# Patient Record
Sex: Male | Born: 1980 | Race: Black or African American | Hispanic: No | Marital: Married | Smoking: Current every day smoker
Health system: Southern US, Community
[De-identification: ages and names within clinical notes are randomized; demographics above are authoritative.]

## PROBLEM LIST (undated history)

## (undated) DIAGNOSIS — F419 Anxiety disorder, unspecified: Secondary | ICD-10-CM

## (undated) DIAGNOSIS — Z8614 Personal history of Methicillin resistant Staphylococcus aureus infection: Secondary | ICD-10-CM

## (undated) DIAGNOSIS — J302 Other seasonal allergic rhinitis: Secondary | ICD-10-CM

## (undated) DIAGNOSIS — Z98811 Dental restoration status: Secondary | ICD-10-CM

## (undated) DIAGNOSIS — H5010 Unspecified exotropia: Secondary | ICD-10-CM

## (undated) DIAGNOSIS — R45850 Homicidal ideations: Secondary | ICD-10-CM

## (undated) DIAGNOSIS — K219 Gastro-esophageal reflux disease without esophagitis: Secondary | ICD-10-CM

## (undated) HISTORY — PX: ABSCESS DRAINAGE: SHX1119

## (undated) HISTORY — DX: Homicidal ideations: R45.850

## (undated) HISTORY — DX: Anxiety disorder, unspecified: F41.9

## (undated) HISTORY — PX: TENDON REPAIR: SHX5111

---
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
## 1999-08-14 ENCOUNTER — Emergency Department (HOSPITAL_COMMUNITY): Admission: EM | Admit: 1999-08-14 | Discharge: 1999-08-14 | Payer: Self-pay | Admitting: Emergency Medicine

## 1999-08-15 ENCOUNTER — Encounter: Payer: Self-pay | Admitting: Emergency Medicine

## 2001-01-21 ENCOUNTER — Ambulatory Visit (HOSPITAL_COMMUNITY): Admission: EM | Admit: 2001-01-21 | Discharge: 2001-01-21 | Payer: Self-pay | Admitting: Emergency Medicine

## 2001-01-21 ENCOUNTER — Encounter: Payer: Self-pay | Admitting: Emergency Medicine

## 2001-02-01 ENCOUNTER — Emergency Department (HOSPITAL_COMMUNITY): Admission: EM | Admit: 2001-02-01 | Discharge: 2001-02-01 | Payer: Self-pay | Admitting: Emergency Medicine

## 2001-05-20 ENCOUNTER — Emergency Department (HOSPITAL_COMMUNITY): Admission: EM | Admit: 2001-05-20 | Discharge: 2001-05-20 | Payer: Self-pay | Admitting: Emergency Medicine

## 2001-05-20 ENCOUNTER — Encounter: Payer: Self-pay | Admitting: Emergency Medicine

## 2002-10-21 ENCOUNTER — Emergency Department (HOSPITAL_COMMUNITY): Admission: EM | Admit: 2002-10-21 | Discharge: 2002-10-21 | Payer: Self-pay | Admitting: Emergency Medicine

## 2003-01-21 ENCOUNTER — Emergency Department (HOSPITAL_COMMUNITY): Admission: EM | Admit: 2003-01-21 | Discharge: 2003-01-21 | Payer: Self-pay | Admitting: Emergency Medicine

## 2003-02-21 ENCOUNTER — Emergency Department (HOSPITAL_COMMUNITY): Admission: EM | Admit: 2003-02-21 | Discharge: 2003-02-21 | Payer: Self-pay | Admitting: Emergency Medicine

## 2003-09-16 ENCOUNTER — Emergency Department (HOSPITAL_COMMUNITY): Admission: EM | Admit: 2003-09-16 | Discharge: 2003-09-16 | Payer: Self-pay | Admitting: Emergency Medicine

## 2003-09-18 ENCOUNTER — Emergency Department (HOSPITAL_COMMUNITY): Admission: EM | Admit: 2003-09-18 | Discharge: 2003-09-18 | Payer: Self-pay | Admitting: Emergency Medicine

## 2003-11-03 ENCOUNTER — Emergency Department (HOSPITAL_COMMUNITY): Admission: EM | Admit: 2003-11-03 | Discharge: 2003-11-03 | Payer: Self-pay

## 2003-11-06 ENCOUNTER — Emergency Department (HOSPITAL_COMMUNITY): Admission: EM | Admit: 2003-11-06 | Discharge: 2003-11-06 | Payer: Self-pay | Admitting: Emergency Medicine

## 2003-11-09 ENCOUNTER — Emergency Department (HOSPITAL_COMMUNITY): Admission: EM | Admit: 2003-11-09 | Discharge: 2003-11-09 | Payer: Self-pay | Admitting: Emergency Medicine

## 2003-12-25 ENCOUNTER — Emergency Department (HOSPITAL_COMMUNITY): Admission: EM | Admit: 2003-12-25 | Discharge: 2003-12-25 | Payer: Self-pay | Admitting: Emergency Medicine

## 2004-01-18 ENCOUNTER — Emergency Department (HOSPITAL_COMMUNITY): Admission: EM | Admit: 2004-01-18 | Discharge: 2004-01-18 | Payer: Self-pay | Admitting: Emergency Medicine

## 2004-01-19 ENCOUNTER — Emergency Department (HOSPITAL_COMMUNITY): Admission: EM | Admit: 2004-01-19 | Discharge: 2004-01-19 | Payer: Self-pay | Admitting: Emergency Medicine

## 2004-01-20 ENCOUNTER — Inpatient Hospital Stay (HOSPITAL_COMMUNITY): Admission: EM | Admit: 2004-01-20 | Discharge: 2004-01-24 | Payer: Self-pay | Admitting: Emergency Medicine

## 2004-10-16 ENCOUNTER — Emergency Department (HOSPITAL_COMMUNITY): Admission: EM | Admit: 2004-10-16 | Discharge: 2004-10-16 | Payer: Self-pay | Admitting: *Deleted

## 2004-10-18 ENCOUNTER — Emergency Department (HOSPITAL_COMMUNITY): Admission: EM | Admit: 2004-10-18 | Discharge: 2004-10-18 | Payer: Self-pay | Admitting: Emergency Medicine

## 2005-03-17 ENCOUNTER — Emergency Department (HOSPITAL_COMMUNITY): Admission: EM | Admit: 2005-03-17 | Discharge: 2005-03-17 | Payer: Self-pay | Admitting: Emergency Medicine

## 2005-08-11 ENCOUNTER — Emergency Department (HOSPITAL_COMMUNITY): Admission: EM | Admit: 2005-08-11 | Discharge: 2005-08-11 | Payer: Self-pay | Admitting: Emergency Medicine

## 2006-01-29 ENCOUNTER — Ambulatory Visit: Payer: Self-pay | Admitting: Family Medicine

## 2006-02-05 ENCOUNTER — Ambulatory Visit: Payer: Self-pay | Admitting: Family Medicine

## 2006-05-31 ENCOUNTER — Emergency Department (HOSPITAL_COMMUNITY): Admission: EM | Admit: 2006-05-31 | Discharge: 2006-05-31 | Payer: Self-pay | Admitting: Emergency Medicine

## 2008-11-06 ENCOUNTER — Telehealth (INDEPENDENT_AMBULATORY_CARE_PROVIDER_SITE_OTHER): Payer: Self-pay | Admitting: *Deleted

## 2009-11-05 ENCOUNTER — Emergency Department (HOSPITAL_COMMUNITY): Admission: EM | Admit: 2009-11-05 | Discharge: 2009-11-05 | Payer: Self-pay | Admitting: Emergency Medicine

## 2010-01-31 ENCOUNTER — Encounter (INDEPENDENT_AMBULATORY_CARE_PROVIDER_SITE_OTHER): Payer: Self-pay | Admitting: *Deleted

## 2010-02-10 ENCOUNTER — Emergency Department (HOSPITAL_COMMUNITY): Admission: EM | Admit: 2010-02-10 | Discharge: 2010-02-11 | Payer: Self-pay | Admitting: Emergency Medicine

## 2010-02-10 ENCOUNTER — Encounter (INDEPENDENT_AMBULATORY_CARE_PROVIDER_SITE_OTHER): Payer: Self-pay | Admitting: *Deleted

## 2010-02-14 ENCOUNTER — Ambulatory Visit: Payer: Self-pay | Admitting: Family Medicine

## 2010-02-14 DIAGNOSIS — M549 Dorsalgia, unspecified: Secondary | ICD-10-CM | POA: Insufficient documentation

## 2010-02-15 ENCOUNTER — Ambulatory Visit: Payer: Self-pay | Admitting: Family Medicine

## 2010-03-08 ENCOUNTER — Ambulatory Visit: Payer: Self-pay | Admitting: Family Medicine

## 2010-03-08 DIAGNOSIS — F410 Panic disorder [episodic paroxysmal anxiety] without agoraphobia: Secondary | ICD-10-CM | POA: Insufficient documentation

## 2010-03-08 DIAGNOSIS — M129 Arthropathy, unspecified: Secondary | ICD-10-CM | POA: Insufficient documentation

## 2010-03-22 ENCOUNTER — Emergency Department (HOSPITAL_COMMUNITY)
Admission: EM | Admit: 2010-03-22 | Discharge: 2010-03-22 | Payer: Self-pay | Source: Home / Self Care | Admitting: Emergency Medicine

## 2010-04-19 ENCOUNTER — Ambulatory Visit
Admission: RE | Admit: 2010-04-19 | Discharge: 2010-04-19 | Payer: Self-pay | Source: Home / Self Care | Attending: Family Medicine | Admitting: Family Medicine

## 2010-04-19 DIAGNOSIS — K602 Anal fissure, unspecified: Secondary | ICD-10-CM | POA: Insufficient documentation

## 2010-04-20 ENCOUNTER — Encounter: Payer: Self-pay | Admitting: Gastroenterology

## 2010-04-21 ENCOUNTER — Telehealth: Payer: Self-pay | Admitting: Family Medicine

## 2010-04-28 ENCOUNTER — Ambulatory Visit: Payer: Self-pay | Admitting: Gastroenterology

## 2010-04-28 NOTE — Assessment & Plan Note (Signed)
Summary: 30 min ov/ok per doc/njr   Vital Signs:  Patient profile:   30 year old male Height:      72 inches Weight:      242.5 pounds Temp:     98.1 degrees F oral Pulse rate:   88 / minute BP sitting:   110 / 90  (left arm) Cuff size:   regular  Vitals Entered By: Kathlene November LPN (March 08, 2010 4:12 PM) CC: back pain off and on- left knee pain- hears knee popping. Pt states wants to get back into power lifting.  Also states that Vicoden is causing him to itch and says the Percocet did not   CC:  back pain off and on- left knee pain- hears knee popping. Pt states wants to get back into power lifting.  Also states that Vicoden is causing him to itch and says the Percocet did not.  History of Present Illness: [name] is a 30 year old male, who comes in today for evaluation of 3 problems.  He has had some low back pain for the past month.  We saw him on November the 22nd.  It the time exam was negative.  We outlined a treatment program, which he did, and he spent about 95% improved however, he complains of still he has intermittent episodes of low back pain.  However, it does not interfere with his ability to work.  He said problems with his left knee for over a year.  He describes pain and swelling and occasional locking.  He states he hurt his knee as a teenager playing sports.  Also for the past 5, years.  He's had panic attacks.  They now have increased in frequency.  They occur about twice a week.  They last for an hour or two.  Telemetry start the same with episodes of rapid heart rate.  This sense to shortness of breath and impending doom.  Current Medications (verified): 1)  Flexeril 10 Mg Tabs (Cyclobenzaprine Hcl) .... Take 1 Tablet By Mouth Three Times A Day 2)  Vicodin Es 7.5-750 Mg Tabs (Hydrocodone-Acetaminophen) .... Take 1 Tablet By Mouth Three Times A Day  Allergies (verified): No Known Drug Allergies  Comments:  Nurse/Medical Assistant: The patient's  medications and allergies were reviewed with the patient and were updated in the Medication and Allergy Lists. Kathlene November LPN (March 08, 2010 4:14 PM)  Past History:  Past medical, surgical, family and social histories (including risk factors) reviewed for relevance to current acute and chronic problems.  Past Medical History: Reviewed history from 11/21/2006 and no changes required. Left leg staph infection-2006  Family History: Reviewed history from 11/21/2006 and no changes required. Family History Diabetes 1st degree relative Family History Hypertension  Social History: Reviewed history from 11/21/2006 and no changes required. Single Current Smoker Alcohol use-no  Review of Systems      See HPI  Physical Exam  General:  Well-developed,well-nourished,in no acute distress; alert,appropriate and cooperative throughout examination Msk:  examination the spine is normal.  No palpable tenderness.  His strength is all normal.  He has extremely tight hamstrings.  The left knee appears normal.  It is not hot.  Ligaments are intact.  He has palpable tenderness over the lateral joint line. Neurologic:  No cranial nerve deficits noted. Station and gait are normal. Plantar reflexes are down-going bilaterally. DTRs are symmetrical throughout. Sensory, motor and coordinative functions appear intact.   Problems:  Medical Problems Added: 1)  Dx of Panic Disorder,no Agoraphobia  (  ICD-300.01) 2)  Dx of Arthropathy Nos, Unspecified Site  (ICD-716.90)  Impression & Recommendations:  Problem # 1:  PANIC DISORDER,NO AGORAPHOBIA (ICD-300.01) Assessment New  His updated medication list for this problem includes:    Celexa 20 Mg Tabs (Citalopram hydrobromide) .Marland Kitchen... 1 tab @ bedtime    Ativan 0.5 Mg Tabs (Lorazepam) .Marland Kitchen... 1 by mouth stat for panic attack  Problem # 2:  ARTHROPATHY NOS, UNSPECIFIED SITE (ICD-716.90) Assessment: New  Problem # 3:  BACK PAIN (ICD-724.5) Assessment:  Improved  His updated medication list for this problem includes:    Flexeril 10 Mg Tabs (Cyclobenzaprine hcl) .Marland Kitchen... Take 1 tablet by mouth three times a day    Vicodin Es 7.5-750 Mg Tabs (Hydrocodone-acetaminophen) .Marland Kitchen... Take 1 tablet by mouth three times a day  Complete Medication List: 1)  Flexeril 10 Mg Tabs (Cyclobenzaprine hcl) .... Take 1 tablet by mouth three times a day 2)  Vicodin Es 7.5-750 Mg Tabs (Hydrocodone-acetaminophen) .... Take 1 tablet by mouth three times a day 3)  Celexa 20 Mg Tabs (Citalopram hydrobromide) .Marland Kitchen.. 1 tab @ bedtime 4)  Ativan 0.5 Mg Tabs (Lorazepam) .Marland Kitchen.. 1 by mouth stat for panic attack  Patient Instructions: 1)  for your back, I would recommend you take 600 mg of Motrin twice daily with food and do the stretching exercises that I outlined 15 minutes daily. 2)  Call the orthopedic group [facility] and make an appointment to see Dr. Cleophas Dunker for evaluation of your knee. 3)  Begin Celexa 20 mg a day at bedtime.  Return in 6 weeks for follow-up.  In the meantime if you have a breakthrough panic attack.  He can take an Ativan .5 stat Prescriptions: CELEXA 20 MG TABS (CITALOPRAM HYDROBROMIDE) 1 tab @ bedtime  #100 x 0   Entered and Authorized by:   Roderick Pee MD   Signed by:   Roderick Pee MD on 03/08/2010   Method used:   Print then Give to Patient   RxID:   [id number] ATIVAN 0.5 MG TABS (LORAZEPAM) 1 by mouth stat for panic attack  #15 x 1   Entered and Authorized by:   Roderick Pee MD   Signed by:   Roderick Pee MD on 03/08/2010   Method used:   Print then Give to Patient   RxID:   [id number] CELEXA 20 MG TABS (CITALOPRAM HYDROBROMIDE) 1 tab @ bedtime  #100 x 0   Entered and Authorized by:   Roderick Pee MD   Signed by:   Roderick Pee MD on 03/08/2010   Method used:   Electronically to        [facility] [id number]* (retail)       [address] [place], [place]  [address]       Ph: [phone]       Fax: [phone]    RxID:   [id number]    Orders Added: 1)  Est. Patient Level IV [93716]

## 2010-04-28 NOTE — Letter (Signed)
Summary: Out of Work  Adult nurse at [employer]  [address]   [place], [place] [address]   Phone: [phone]  Fax: [phone]    February 15, 2010   Employee:  [name]    To Whom It May Concern:   For Medical reasons, please excuse the above named employee from work for the following dates:  Start:   February 14, 2010  End:   February 22, 2010  If you need additional information, please feel free to contact our office.         Sincerely,    Kelle Darting, MD

## 2010-04-28 NOTE — Assessment & Plan Note (Signed)
Summary: 6 WK ROV / RS   Vital Signs:  Patient profile:   30 year old male Weight:      251 pounds Temp:     98.1 degrees F oral BP sitting:   110 / 80  (left arm) Cuff size:   regular  Vitals Entered By: Kern Reap CMA Duncan Dull) (April 19, 2010 3:33 PM) CC: follow-up visit   CC:  follow-up visit.  History of Present Illness: ed is a 30 year old male, single, nonsmoker, who comes in today for evaluation of two problems.  We diagnosed him to have panic attacks and start him on Celexa 20 mg nightly on December the 13th of this past year.  We also gave him Ativan .5 to take p.r.n. he says that he initially got much better, but now the back.  Frequency of the same.  A couple weeks ago.  He stated he had had urgent care because of rectal bleeding and pain.  He was told he had a rectal fissure.  He was given medication tsolved and now is back  Allergies: No Known Drug Allergies  Past History:  Past medical, surgical, family and social histories (including risk factors) reviewed for relevance to current acute and chronic problems.  Past Medical History: Reviewed history from 11/21/2006 and no changes required. Left leg staph infection-2006  Family History: Reviewed history from 11/21/2006 and no changes required. Family History Diabetes 1st degree relative Family History Hypertension  Social History: Reviewed history from 11/21/2006 and no changes required. Single Current Smoker Alcohol use-no  Review of Systems      See HPI  Physical Exam  General:  Well-developed,well-nourished,in no acute distress; alert,appropriate and cooperative throughout examination Psych:  Cognition and judgment appear intact. Alert and cooperative with normal attention span and concentration. No apparent delusions, illusions, hallucinations   Impression & Recommendations:  Problem # 1:  PANIC DISORDER,NO AGORAPHOBIA (ICD-300.01) Assessment Improved  The following medications were  removed from the medication list:    Celexa 20 Mg Tabs (Citalopram hydrobromide) .Marland Kitchen... 1 tab @ bedtime His updated medication list for this problem includes:    Ativan 0.5 Mg Tabs (Lorazepam) .Marland Kitchen... 1 by mouth stat for panic attack    Celexa 40 Mg Tabs (Citalopram hydrobromide) .Marland Kitchen... 1 tab @ bedtime  Problem # 2:  RECTAL FISSURE (ICD-565.0) Assessment: New  Orders: Gastroenterology Referral (GI)  Complete Medication List: 1)  Flexeril 10 Mg Tabs (Cyclobenzaprine hcl) .... Take 1 tablet by mouth three times a day 2)  Vicodin Es 7.5-750 Mg Tabs (Hydrocodone-acetaminophen) .... Take 1 tablet by mouth three times a day 3)  Ativan 0.5 Mg Tabs (Lorazepam) .Marland Kitchen.. 1 by mouth stat for panic attack 4)  Nupercainal 1 % Oint (Dibucaine) .... Apply 4 x day 5)  Celexa 40 Mg Tabs (Citalopram hydrobromide) .Marland Kitchen.. 1 tab @ bedtime 6)  Tramadol Hcl 50 Mg Tabs (Tramadol hcl) .... Take 1 tablet by mouth two times a day as needed pain  Patient Instructions: 1)  increase the Celexa to 40 mg daily, and taken Ativan p.r.n. 2)   30 ounces of water daily. 3)  Milk of Magnesia 34ounces twice daily or prune juice 4  ounces twice daily.  nupercainal          apply small amounts 3 to 4 times daily, and we will get you set up a consult and GI Prescriptions: CELEXA 40 MG TABS (CITALOPRAM HYDROBROMIDE) 1 tab @ bedtime  #100 x 3   Entered and Authorized by:  Roderick Pee MD   Signed by:   Roderick Pee MD on 04/19/2010   Method used:   Print then Give to Patient   RxID:   [id number] NUPERCAINAL 1 % OINT (DIBUCAINE) apply 4 x day  #30 gr x 2   Entered and Authorized by:   Roderick Pee MD   Signed by:   Roderick Pee MD on 04/19/2010   Method used:   Print then Give to Patient   RxID:   [id number] TRAMADOL HCL 50 MG TABS (TRAMADOL HCL) Take 1 tablet by mouth two times a day as needed pain  #30 x 0   Entered and Authorized by:   Roderick Pee MD   Signed by:   Roderick Pee MD on 04/19/2010   Method  used:   Print then Give to Patient   RxID:   [id number] ATIVAN 0.5 MG TABS (LORAZEPAM) 1 by mouth stat for panic attack  #30 x 0   Entered and Authorized by:   Roderick Pee MD   Signed by:   Roderick Pee MD on 04/19/2010   Method used:   Print then Give to Patient   RxID:   [id number] CELEXA 40 MG TABS (CITALOPRAM HYDROBROMIDE) 1 tab @ bedtime  #100 x 3   Entered and Authorized by:   Roderick Pee MD   Signed by:   Roderick Pee MD on 04/19/2010   Method used:   Electronically to        [id number]* (retail)       [address] [place], [place]  [address]       Ph: [phone]       Fax: [phone]   RxID:   [id number] NUPERCAINAL 1 % OINT (DIBUCAINE) apply 4 x day  #30 gr x 2   Entered and Authorized by:   Roderick Pee MD   Signed by:   Roderick Pee MD on 04/19/2010   Method used:   Electronically to        [id number]* (retail)       [address] [place], [place]  [address]       Ph: [phone]       Fax: [phone]   RxID:   [id number]    Orders Added: 1)  Gastroenterology Referral [GI] 2)  Est. Patient Level IV [02585]

## 2010-04-28 NOTE — Letter (Signed)
Summary: Out of Work  Adult nurse at [employer]  [address]   [place], [place] [address]   Phone: [phone]  Fax: [phone]    February 14, 2010   Employee:  IZAYAH MINER Klumpp    To Whom It May Concern:   For Medical reasons, please excuse the above named employee from work for the following dates:  Start:   February 14, 2010  End:   February 16, 2010  If you need additional information, please feel free to contact our office.         Sincerely,    Kelle Darting, MD

## 2010-04-28 NOTE — Progress Notes (Signed)
Summary: Pt says pain med making him itch. Req different pain med  Phone Note Call from Patient Call back at [phone]       Caller: Patient Summary of Call: Pt called and said that the pain med prescribed to him is making him itch. Pt req a diff pain med called in to [facility] [address]...  Pls notify pt when this has been called in.  Initial call taken by: Lucy Antigua,  April 21, 2010 9:20 AM  Follow-up for Phone Call        if the pain medication is causing itching, then stop it completely, and take Motrin 400 mg 3 times a day p.r.n. Follow-up by: Roderick Pee MD,  April 21, 2010 2:07 PM  Additional Follow-up for Phone Call Additional follow up Details #1::        Phone Call Completed Additional Follow-up by: Kern Reap CMA Duncan Dull),  April 21, 2010 4:20 PM

## 2010-04-28 NOTE — Letter (Signed)
Summary: New Patient letter  [facility]  [address] [place], [place] [address]   Phone: [phone]  Fax: [phone]       04/20/2010 MRN: [id number]  CRISTINA MATTERN [address] [place], [place]  [address]  Dear Mr. RINGLER,  Welcome to the Gastroenterology Division at [facility].    You are scheduled to see Dr.  Jarold Motto on 04-28-10 at 10am on the 3rd floor at [facility], [address]  We ask that you try to arrive at our office 15 minutes prior to your appointment time to allow for check-in.  We would like you to complete the enclosed self-administered evaluation form prior to your visit and bring it with you on the day of your appointment.  We will review it with you.  Also, please bring a complete list of all your medications or, if you prefer, bring the medication bottles and we will list them.  Please bring your insurance card so that we may make a copy of it.  If your insurance requires a referral to see a specialist, please bring your referral form from your primary care physician.  Co-payments are due at the time of your visit and may be paid by cash, check or credit card.     Your office visit will consist of a consult with your physician (includes a physical exam), any laboratory testing he/she may order, scheduling of any necessary diagnostic testing (e.g. x-ray, ultrasound, CT-scan), and scheduling of a procedure (e.g. Endoscopy, Colonoscopy) if required.  Please allow enough time on your schedule to allow for any/all of these possibilities.    If you cannot keep your appointment, please call [phone] to cancel or reschedule prior to your appointment date.  This allows [place] the opportunity to schedule an appointment for another patient in need of care.  If you do not cancel or reschedule by 5 p.m. the business day prior to your appointment date, you will be charged a $50.00 late cancellation/no-show fee.    Thank you for choosing  [facility] for your medical needs.  We appreciate the opportunity to care for you.  Please visit [place] at our website  to learn more about our practice.                     Sincerely,                                                             The Gastroenterology Division

## 2010-04-28 NOTE — Assessment & Plan Note (Signed)
Summary: 1 day rov/njr 445p   Vital Signs:  Patient profile:   30 year old male BP sitting:   150 / 108  (left arm) Cuff size:   regular  Vitals Entered By: Kern Reap CMA Duncan Dull) (February 15, 2010 5:02 PM)   History of Present Illness: [name] is a 30 year old male, who comes in today for evaluation of back pain.  We saw him yesterday with severe back pain.  We placed him at complete bed rest with some Flexeril and Vicodin.  He comes back today saying 50% improved.  As noted, previously.  He's also has other issues with knee pain and panic attacks.  Therefore, will set aside a time for a complete physical examination.  Allergies: No Known Drug Allergies  Past History:  Past medical, surgical, family and social histories (including risk factors) reviewed for relevance to current acute and chronic problems.  Past Medical History: Reviewed history from 11/21/2006 and no changes required. Left leg staph infection-2006  Family History: Reviewed history from 11/21/2006 and no changes required. Family History Diabetes 1st degree relative Family History Hypertension  Social History: Reviewed history from 11/21/2006 and no changes required. Single Current Smoker Alcohol use-no  Review of Systems      See HPI  Physical Exam  General:  Well-developed,well-nourished,in no acute distress; alert,appropriate and cooperative throughout examination Msk:  No deformity or scoliosis noted of thoracic or lumbar spine.  ......Marland Kitchenextremely tight hamstrings, right Pulses:  R and L carotid,radial,femoral,dorsalis pedis and posterior tibial pulses are full and equal bilaterally Extremities:  No clubbing, cyanosis, edema, or deformity noted with normal full range of motion of all joints.   Neurologic:  No cranial nerve deficits noted. Station and gait are normal. Plantar reflexes are down-going bilaterally. DTRs are symmetrical throughout. Sensory, motor and coordinative functions appear  intact.   Impression & Recommendations:  Problem # 1:  BACK PAIN (ICD-724.5) Assessment Improved  His updated medication list for this problem includes:    Flexeril 10 Mg Tabs (Cyclobenzaprine hcl) .Marland Kitchen... Take 1 tablet by mouth three times a day    Vicodin Es 7.5-750 Mg Tabs (Hydrocodone-acetaminophen) .Marland Kitchen... Take 1 tablet by mouth three times a day  Orders: Physical Therapy Referral (PT)  Complete Medication List: 1)  Chantix Starting Month Pak 0.5 Mg X 11 & 1 Mg X 42 Tabs (Varenicline tartrate) .... Use as directed 2)  Flexeril 10 Mg Tabs (Cyclobenzaprine hcl) .... Take 1 tablet by mouth three times a day 3)  Vicodin Es 7.5-750 Mg Tabs (Hydrocodone-acetaminophen) .... Take 1 tablet by mouth three times a day  Patient Instructions: 1)  take 600 mg of Motrin twice daily with food and a half of the Flexeril and Vicodin at bedtime as needed . 2)  We will also be to set up for physical therapy next week.  Okay to return to work Wednesday. 3)  Set up sometime in the next two to 3 weeks for a 30 minute appointment for a complete physical exam.........Marland Kitchen we will do the lab work the same day, you come in........  have nothing to eat for 3 to 4 hours prior to appointment   Orders Added: 1)  Physical Therapy Referral [PT] 2)  Est. Patient Level III [[address]]

## 2010-04-28 NOTE — Assessment & Plan Note (Signed)
Summary: F/U FROM ER INJURED BACK//SLM   Vital Signs:  Patient profile:   30 year old male Height:      72 inches Weight:      234 pounds BMI:     31.85 Temp:     97.8 degrees F oral BP sitting:   120 / 80  (left arm) Cuff size:   regular  Vitals Entered By: Kern Reap CMA Duncan Dull) (February 14, 2010 3:56 PM) CC: follow-up visit from er   CC:  follow-up visit from er.  History of Present Illness: [name] is a 30 year old male, smoker, who comes in today for evaluation of left lumbar back pain.  he states last Thursday morning he awoke with some left lower lumbar back pain.  Later on that night.  It became severe and he went to the emergency room.  He had a complete diagnostic evaluation, including a CT scan of his abdomen.  Urinalysis, etc., all of which was normal.  He was diagnosed to have muscle spasm.  Given Percocet, number 15.  Valium, number 12, and sent home.  He states his pain today is the same as it was last week.  He describes as constant, sharp, sometimes dull and located in the left lumbar area.  It does not radiate.  No bowel or bladder dysfunction.  No history of trauma.  He also wants to talk about his knee and panic attacks.  I recommend he set aside.  A time to do that    Allergies (verified): No Known Drug Allergies  Past History:  Past medical, surgical, family and social histories (including risk factors) reviewed for relevance to current acute and chronic problems.  Past Medical History: Reviewed history from 11/21/2006 and no changes required. Left leg staph infection-2006  Family History: Reviewed history from 11/21/2006 and no changes required. Family History Diabetes 1st degree relative Family History Hypertension  Social History: Reviewed history from 11/21/2006 and no changes required. Single Current Smoker Alcohol use-no  Review of Systems      See HPI  Physical Exam  General:  Well-developed,well-nourished,in no acute distress;  alert,appropriate and cooperative throughout examination Msk:  No deformity or scoliosis noted of thoracic or lumbar spine.  .........Marland Kitchenextremely tight hamstrings bilaterally Pulses:  R and L carotid,radial,femoral,dorsalis pedis and posterior tibial pulses are full and equal bilaterally Extremities:  No clubbing, cyanosis, edema, or deformity noted with normal full range of motion of all joints.   Neurologic:  No cranial nerve deficits noted. Station and gait are normal. Plantar reflexes are down-going bilaterally. DTRs are symmetrical throughout. Sensory, motor and coordinative functions appear intact.   Problems:  Medical Problems Added: 1)  Dx of Back Pain  (ICD-724.5)  Impression & Recommendations:  Problem # 1:  BACK PAIN (ICD-724.5) Assessment New  The following medications were removed from the medication list:    Percocet 5-325 Mg Tabs (Oxycodone-acetaminophen) .Marland Kitchen... Take one tab by mouth every 6 hour as needed for pain His updated medication list for this problem includes:    Flexeril 10 Mg Tabs (Cyclobenzaprine hcl) .Marland Kitchen... Take 1 tablet by mouth three times a day    Vicodin Es 7.5-750 Mg Tabs (Hydrocodone-acetaminophen) .Marland Kitchen... Take 1 tablet by mouth three times a day  Complete Medication List: 1)  Chantix Starting Month Pak 0.5 Mg X 11 & 1 Mg X 42 Tabs (Varenicline tartrate) .... Use as directed 2)  Flexeril 10 Mg Tabs (Cyclobenzaprine hcl) .... Take 1 tablet by mouth three times a day 3)  Vicodin  Es 7.5-750 Mg Tabs (Hydrocodone-acetaminophen) .... Take 1 tablet by mouth three times a day  Patient Instructions: 1)  stay at bed rest today and all day tomorrow, return 4;  45 to more afternoon for follow-up.......... this means a heating pad................ use low heat...............  Get up to go to the bathroom in the otherwise  stay at complete bed rest. 2)  Motrin 600 mg twice daily with food. 3)  Vicodin and Flexeril one half of each 3 times a day. 4)  We will also be to  set u up  for physical therapy. 5)  Return next week for evaluation of your knee, and the issue with the panic attacks Prescriptions: VICODIN ES 7.5-750 MG TABS (HYDROCODONE-ACETAMINOPHEN) Take 1 tablet by mouth three times a day  #30 x 1   Entered and Authorized by:   Roderick Pee MD   Signed by:   Roderick Pee MD on 02/14/2010   Method used:   Print then Give to Patient   RxID:   [id number] FLEXERIL 10 MG TABS (CYCLOBENZAPRINE HCL) Take 1 tablet by mouth three times a day  #30 x 1   Entered and Authorized by:   Roderick Pee MD   Signed by:   Roderick Pee MD on 02/14/2010   Method used:   Print then Give to Patient   RxID:   [id number]    Orders Added: 1)  Est. Patient Level IV [71062]  Appended Document: Orders Update    Clinical Lists Changes  Orders: Added new Referral order of Physical Therapy Referral (PT) - Signed

## 2010-05-06 ENCOUNTER — Inpatient Hospital Stay (INDEPENDENT_AMBULATORY_CARE_PROVIDER_SITE_OTHER)
Admission: RE | Admit: 2010-05-06 | Discharge: 2010-05-06 | Disposition: A | Discharge status: Home / Self Care | Payer: Federal, State, Local not specified - PPO | Source: Ambulatory Visit | Attending: Family Medicine | Admitting: Family Medicine

## 2010-05-06 DIAGNOSIS — L02219 Cutaneous abscess of trunk, unspecified: Secondary | ICD-10-CM

## 2010-05-06 DIAGNOSIS — L03319 Cellulitis of trunk, unspecified: Secondary | ICD-10-CM

## 2010-05-10 LAB — CULTURE, ROUTINE-ABSCESS: Culture: NO GROWTH

## 2010-05-16 ENCOUNTER — Encounter: Payer: Self-pay | Admitting: Family Medicine

## 2010-05-17 ENCOUNTER — Ambulatory Visit: Payer: Self-pay | Admitting: Family Medicine

## 2010-05-17 DIAGNOSIS — Z0289 Encounter for other administrative examinations: Secondary | ICD-10-CM

## 2010-05-31 ENCOUNTER — Ambulatory Visit: Payer: Self-pay | Admitting: Gastroenterology

## 2010-05-31 ENCOUNTER — Telehealth: Payer: Self-pay | Admitting: Gastroenterology

## 2010-05-31 DIAGNOSIS — R69 Illness, unspecified: Secondary | ICD-10-CM

## 2010-06-06 ENCOUNTER — Emergency Department (HOSPITAL_COMMUNITY)
Admission: EM | Admit: 2010-06-06 | Discharge: 2010-06-06 | Disposition: A | Discharge status: Home / Self Care | Payer: Federal, State, Local not specified - PPO | Attending: Emergency Medicine | Admitting: Emergency Medicine

## 2010-06-06 DIAGNOSIS — F17200 Nicotine dependence, unspecified, uncomplicated: Secondary | ICD-10-CM | POA: Insufficient documentation

## 2010-06-06 DIAGNOSIS — K602 Anal fissure, unspecified: Secondary | ICD-10-CM | POA: Insufficient documentation

## 2010-06-06 DIAGNOSIS — K6289 Other specified diseases of anus and rectum: Secondary | ICD-10-CM | POA: Insufficient documentation

## 2010-06-06 LAB — HEMOCCULT GUIAC POC 1CARD (OFFICE): Fecal Occult Bld: POSITIVE

## 2010-06-07 LAB — URINALYSIS, ROUTINE W REFLEX MICROSCOPIC
Bilirubin Urine: NEGATIVE
Glucose, UA: NEGATIVE mg/dL
Hgb urine dipstick: NEGATIVE
Ketones, ur: NEGATIVE mg/dL
Nitrite: NEGATIVE
Protein, ur: NEGATIVE mg/dL
Specific Gravity, Urine: 1.022 (ref 1.005–1.030)
Urobilinogen, UA: 0.2 mg/dL (ref 0.0–1.0)
pH: 6.5 (ref 5.0–8.0)

## 2010-06-07 LAB — BASIC METABOLIC PANEL
BUN: 14 mg/dL (ref 6–23)
CO2: 23 mEq/L (ref 19–32)
Calcium: 8.9 mg/dL (ref 8.4–10.5)
Chloride: 105 mEq/L (ref 96–112)
Creatinine, Ser: 1.17 mg/dL (ref 0.4–1.5)
GFR calc Af Amer: >60 mL/min (ref >60)
GFR calc non Af Amer: >60 mL/min (ref >60)
Glucose, Bld: 107 mg/dL — ABNORMAL HIGH (ref 70–99)
Potassium: 3.7 mEq/L (ref 3.5–5.1)
Sodium: 139 mEq/L (ref 135–145)

## 2010-06-07 LAB — CBC
HCT: 43.4 % (ref 39.0–52.0)
Hemoglobin: 15 g/dL (ref 13.0–17.0)
MCH: 32.5 pg (ref 26.0–34.0)
MCHC: 34.6 g/dL (ref 30.0–36.0)
MCV: 93.9 fL (ref 78.0–100.0)
Platelets: 150 10*3/uL (ref 150–400)
RBC: 4.63 MIL/uL (ref 4.22–5.81)
RDW: 13 % (ref 11.5–15.5)
WBC: 8.4 10*3/uL (ref 4.0–10.5)

## 2010-06-07 LAB — DIFFERENTIAL
Basophils Absolute: 0 10*3/uL (ref 0.0–0.1)
Basophils Relative: 0 % (ref 0–1)
Eosinophils Absolute: 0.1 10*3/uL (ref 0.0–0.7)
Eosinophils Relative: 1 % (ref 0–5)
Lymphocytes Relative: 38 % (ref 12–46)
Lymphs Abs: 3.2 10*3/uL (ref 0.7–4.0)
Monocytes Absolute: 0.7 10*3/uL (ref 0.1–1.0)
Monocytes Relative: 8 % (ref 3–12)
Neutro Abs: 4.5 10*3/uL (ref 1.7–7.7)
Neutrophils Relative %: 53 % (ref 43–77)

## 2010-06-23 NOTE — Progress Notes (Signed)
Summary: NO SHOW  ---- Converted from flag ---- ---- 05/31/2010 10:10 AM, Mardella Layman MD [facility] wrote: charge no show fee, do not reschedule with me ------------------------------    Patient BILLED.  Note put in EPIC so that pt will not be rescheduled w/Dr Jarold Motto. Leanor Kail [facility]  June 13, 2010 4:18 PM

## 2010-08-12 NOTE — H&P (Signed)
[facility]  Patient:    TRIGGER, FRASIER Visit Number: [id number] MRN: [id number]          Service Type: EMS Location: ED Attending Physician:  Hanley Seamen Dictated by:   Nicki Reaper, M.D. Admit Date:  01/21/2001   CC:         Nicki Reaper, M.D. 2 copies                         History and Physical  PREOPERATIVE DIAGNOSIS:  Laceration of right hand, wrist, little finger.  POSTOPERATIVE DIAGNOSIS:  Laceration of right hand, wrist, little finger.  OPERATION:  Removal of foreign bodies, wrist joint.  Repair of extensor carpi ulnaris.  Capsule metacarpal phalangeal joint, little finger, right hand.  SURGEON:  Nicki Reaper, M.D.  ASSISTANT:  None.  ANESTHESIA:  General.  ANESTHESIOLOGIST:  Lestine Box, M.D.  HISTORY OF PRESENT ILLNESS:  The patient is a 30 year old male who hit a window suffering a laceration to the ulnar aspect of his wrist and over the metaphalangeal joint of his little finger.  DESCRIPTION OF PROCEDURE:  The patient was brought to the operating room where a general anesthetic was carried out without difficulty.  He was prepped and draped using Betadine scrubbing solution with the right arm free.  A wound was opened.  The laceration to the ______ were identified.  The joint opening was identified.  Retractors were placed.  The hyperthenar musculature was found to be lacerated.  The joint was opened and debrided.  Several loose pieces of glass were removed.  An irrigation catheter was placed, and the joint was copiously irrigated with Bacitracin containing saline solution, retrieving three more pieces of glass.  The joint capsule was then closed with figure-of-eight 4-0 Vicryl sutures.  The hyperthenar musculature was repaired with figure-of-eight 4-0 Vicryl.  The extensor carpi ulnaris was repaired with a modified Kessler using 3-0 Ethibond and 3-0 Ethibond in the epitenon.  The area was again irrigated.  The skin  closed with interrupted 5-0 Nylon sutures. The distal laceration was then opened, inspected, the capsule was found to be lacerated.  This was irrigated and repaired with figure-of-eight 4-0 Vicryl sutures.  The extensor tendon was intact.  Traction produced no laxity proximally.  Flexion of the wrist revealed full extension to the fingers.  The wound was irrigated.  Skin closed with interrupted 5-0 Nylon sutures.  Sterile compressive dressing and splint was applied.  The patient tolerated the procedure well.  Was taken to the recovery room for observation in satisfactory condition.  He is discharged home to return to the [facility] in one week on Vicodin and Keflex. Dictated by:   Nicki Reaper, M.D. Attending Physician:  Hanley Seamen DD:  01/21/01 TD:  01/22/01 Job: [id number]

## 2010-08-12 NOTE — Discharge Summary (Signed)
[name], [name]              ACCOUNT NO.:  [id number]   MEDICAL RECORD NO.:  [id number]          PATIENT TYPE:  INP   LOCATION:  0351                         FACILITY:  [facility]   PHYSICIAN:  Elliot Cousin, M.D.    DATE OF BIRTH:  [birth year]   DATE OF ADMISSION:  01/20/2004  DATE OF DISCHARGE:  01/24/2004                                 DISCHARGE SUMMARY   DISCHARGE DIAGNOSES:  1.  Left lower extremity abscess secondary to methicillin-resistant      Staphylococcus aureus with local cellulitis.      1.  Status post local incision and drainage.  2.  History of recurrent left lower extremity abscesses.  3.  Status post surgical repair of trauma to the right hand in 2003.  4.  Tobacco abuse.   DISCHARGE MEDICATIONS:  1.  Levaquin 750 mg one tablet daily for 7 additional days.  2.  Percocet 5 mg every four hours as needed for pain.   DISCHARGE DISPOSITION:  The patient was discharged to home in improved and  stable condition on January 24, 2004.  He was given several names of local  physicians to follow up with per his discretion.  He was advised to follow  up with his new physician in 1-2 weeks.   HISTORY OF PRESENT ILLNESS:  The patient is a 30 year old man with past  medical history significant for history of recurrent abscesses of the left  calf for the past two months.  He was seen in the emergency department at  [facility] at least four times in the last two months for the same  problem and each time had an I&D.  The current episode started with pain and  swelling of the left calf on January 15, 2004 to January 16, 2004.  There  was no history of trauma.  There was no fever.  He was seen in the emergency  department at [facility] on January 18, 2004 and treated  conservatively initially.  He returned on January 19, 2004 with worsening  symptoms. He had an incision and drainage per the emergency room physician  and then was discharged home on Vicodin and  doxycycline.  However, the  swelling, redness, and pain worsened and therefore he returned.   HOSPITAL COURSE:  Left lower extremity abscess secondary to methicillin-  resistant Staphylococcus aureus with local cellulitis.  The management  started in the emergency department when the patient was started on  treatment with Ancef 1 g every 6 hours.  He was treated with Dilaudid as  needed for pain.  His left leg was kept elevated.  Wound care was provided  by the nursing staff with wet-to-dry dressing changes on a daily basis.  Prior to antibiotic therapy, cultures were obtained via blood cultures and  specimen cultures from the abscessed area.  The abscessed area and the  status post I&D area was approximately 1 cm in diameter.  The area drained a  small amount of serosanguineous and mildly purulent fluid on a daily basis.  Eventually, the abscess specimen grew out methicillin-resistant  Staphylococcus aureus which  was sensitive to vancomycin and Levaquin.  The  Ancef was therefore discontinued and the patient was started on both  vancomycin and Levaquin initially.  After an initial slow healing with  Ancef, the patient's pain and swelling and erythema substantially regressed  following initiation of vancomycin and Levaquin.   The patient remained afebrile during the entire hospital course.  The blood  cultures remained negative during the hospital course.  His white blood cell  count was within normal limits during hospital course.  The x-ray of his  left leg revealed soft tissue swelling, but no evidence of osteomyelitis or  bone destruction.  The patient was advised to stop smoking to facilitate  wound healing.  He was encouraged to wash his hands prior to cleaning the  left leg abscessed area at home.  He was advised to apply a Band-Aid and or  a 2 x 2 gauze to the area daily.  The swelling, erythema and pain completely  resolved during the hospital course.  There was only a small area  at the I&D  site which was still draining although it was minimal.  It appears that  these areas of abscesses generally start as a boil or a furuncle and becomes  infected as the patient scratches it or touches it.  Again, the patient was  advised to follow good hand washing hygeine.     Deni   DF/MEDQ  D:  01/27/2004  T:  01/27/2004  Job:  [id number]

## 2010-08-12 NOTE — H&P (Signed)
[name], [name]              ACCOUNT NO.:  [id number]   MEDICAL RECORD NO.:  [id number]          PATIENT TYPE:  EMS   LOCATION:  ED                           FACILITY:  [facility]   PHYSICIAN:  Isidor Holts, M.D.  DATE OF BIRTH:  [birth year]   DATE OF ADMISSION:  01/20/2004  DATE OF DISCHARGE:                                HISTORY & PHYSICAL   PRIMARY CARE PHYSICIAN:  Unassigned.   CHIEF COMPLAINT:  Recurrent abscess left leg.   HISTORY OF PRESENT ILLNESS:  Essentially as above.  This is a 30 year old  African American male with a history of recurrent abscesses of the left calf  for the past two months.  As a matter of fact, he has been seen in the  emergency department at [facility] at least four times in the last  two months for the same problem and each time had an I&D.  The current  episode started with pain and swelling of the left calf on January 15, 2004  to January 16, 2004.  No history of trauma.  No fever.  Was seen in the  emergency department at [facility] on January 18, 2004  and treated conservatively initially.   He presented with worsening symptoms on January 19, 2004 and had an incision  and drainage and then was discharged on Vicodin and doxycycline.  However,  swelling, redness and pain has now extended down the left leg necessitating  a return to the emergency department.   PAST MEDICAL HISTORY:  1.  Status post surgical repair of trauma right hand 2003.  2.  Recurrent abscesses left calf, status post I&D x 4 in the last two      months.   MEDICATIONS:  Not on any regular medication.   ALLERGIES:  No known drug allergies.   SOCIAL HISTORY:  Single.  Works as a Tax inspector.  Smokes about a  quarter of a pack a day.  Drinks alcohol only occasionally.  Denies drug  abuse.  Has one daughter who is alive and well.   FAMILY HISTORY:  Has six brothers and four sisters-all of whom are alive and  well.  Father had  diabetes mellitus and hypertension.  Mother is alive an  well.   REVIEW OF SYSTEMS:  Essentially as per HPI and chief complaint. Otherwise  negative.   PHYSICAL EXAMINATION:  GENERAL:  The patient is apyrexia, comfortable, not  short of breath at rest, alert, and communicative.  HEENT:  No pallor, jaundice, or conjunctival injection.  Throat is clear.  NECK:  Supple.  JVP not seen.  No palpable lymphadenopathy.  No palpable  goiter.  CHEST:  Clear to auscultation.  No wheezes and no crackles.  HEART:  Normal regular and no murmurs.  ABDOMEN:  Flat, soft, and nontender.  There is no palpable organomegaly.  No  palpable masses.  Normal bowel sounds.  EXTREMITIES:  Right leg is essentially normal.  Left leg is swollen,  erythematous.  There is fluctuant, circumscribed area of swelling  approximately 6 cm by 5 cm with incision and drainage wound  and a draining  site.  There is no evidence of tinea interdigitalis.   LABORATORY DATA:  Labs are currently unavailable.   ASSESSMENT/PLAN:  1.  Recurrent abscess left calf:  Status post incision and drainage January 19, 2004.  We will admit the patient to general medical floor, continue      b.i.d. dressings.  Start the patient on intravenous Ancef after blood      cultures are taken.  We will also give the patient Duragesic medication.      If resolution is slow, the patient may require surgical consultation.      Meanwhile, we will arrange x-ray of the left leg to rule out possible      underlying osteomyelitis.  2.  Cellulitis:  Complicating #1 above.  We will manage with intravenous      antibiotics.  Also place the patient on deep vein thrombosis prophylaxis      and elevated infected leg.     Chri   CO/MEDQ  D:  01/20/2004  T:  01/21/2004  Job:  [id number]

## 2010-08-12 NOTE — Assessment & Plan Note (Signed)
[facility] OFFICE NOTE   ANDERSSON, LARRABEE                     MRN:          [id number]  DATE:01/29/2006                            DOB:          28-Feb-1981    NEW PATIENT EVALUATION   Mr. Strege is a 30 year old single male who comes in today as a new patient  for evaluation of multiple concerns.   PAST MEDICAL HISTORY:  Hospitalized for a staph infection, left leg in 2006.  It was I and D'ed, packed.  He was on IV antibiotics for 4 days, recovered,  no sequelae.   OUTPATIENT SURGERY:  None.   ILLNESSES:  None.   INJURIES:  None.   DRUG ALLERGIES:  None.   He smokes 3 cigarettes a day.  He is trying to quit.  Does not drink any  alcohol.   MEDICATION HISTORY:  He takes no medications on a regular basis.   Today's concerns are:  1. He tore a left pectoralis lifting weights.  He says he was at the gym      about 2 months ago, lifting 300 pounds, and had severe pain in his left      pectoral muscle.  It has gradually gotten better.  He still cannot do      much lifting.  He would like that evaluated  2. He also would like to discuss a smoking cessation program.  3. He thinks he might have an UTI, although he is having no urinary tract      symptoms.   REVIEW OF SYSTEMS:  Negative.   FAMILY HISTORY:  Father has hypertension, diabetic.  Mother is in good  health, hypertension.  He has got 7 brothers, 2 sisters, all of whom are in  good health.   SOCIAL HISTORY:  He does Aeronautical engineer.  He works for a [employer] in  [place].  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  He is single.   PHYSICAL EVALUATION:  Height 5 feet 11 inches.  Weight 230 pounds.  BP  117/81.  Pulse 62 and regular.  He is afebrile.  GENERAL:  He is a well-developed, well-nourished, muscular male in no acute  distress.  HEAD, EYES, EARS, NOSE, AND THROAT:  Negative.  NECK:  Supple.  CHEST:  Shows a  tenderness in the left pectoralis.  You can actually feel  defect where a portion of the muscle tore lose.  GENITAL:  Normal.   LABORATORY DATA:  Normal UA.   1. Tear left pectoralis muscle.  Advised Motrin, let it heal.  Explained      it would take 4 to 5 months for it to completely scar down.  2. Normal urinalysis, reassured.  3. Status post staphylococcal infection.  4. Tobacco abuse.  Outlined a program to include Chantix smoking      cessation.    ______________________________  Eugenio Hoes Tawanna Cooler, MD    JAT/MedQ  DD: 01/29/2006  DT: 01/30/2006  Job #: [id number]

## 2010-08-22 ENCOUNTER — Inpatient Hospital Stay (INDEPENDENT_AMBULATORY_CARE_PROVIDER_SITE_OTHER)
Admission: RE | Admit: 2010-08-22 | Discharge: 2010-08-22 | Disposition: A | Discharge status: Home / Self Care | Payer: Federal, State, Local not specified - PPO | Source: Ambulatory Visit | Attending: Family Medicine | Admitting: Family Medicine

## 2010-08-22 DIAGNOSIS — K602 Anal fissure, unspecified: Secondary | ICD-10-CM

## 2010-10-13 ENCOUNTER — Inpatient Hospital Stay (INDEPENDENT_AMBULATORY_CARE_PROVIDER_SITE_OTHER)
Admission: RE | Admit: 2010-10-13 | Discharge: 2010-10-13 | Disposition: A | Discharge status: Home / Self Care | Payer: Federal, State, Local not specified - PPO | Source: Ambulatory Visit | Attending: Emergency Medicine | Admitting: Emergency Medicine

## 2010-10-13 ENCOUNTER — Inpatient Hospital Stay (HOSPITAL_COMMUNITY)
Admission: EM | Admit: 2010-10-13 | Discharge: 2010-10-14 | DRG: 566 | Disposition: A | Discharge status: Home / Self Care | Payer: Federal, State, Local not specified - PPO | Source: Ambulatory Visit | Attending: Internal Medicine | Admitting: Internal Medicine

## 2010-10-13 DIAGNOSIS — N179 Acute kidney failure, unspecified: Secondary | ICD-10-CM | POA: Diagnosis present

## 2010-10-13 DIAGNOSIS — F17200 Nicotine dependence, unspecified, uncomplicated: Secondary | ICD-10-CM | POA: Diagnosis present

## 2010-10-13 DIAGNOSIS — E876 Hypokalemia: Secondary | ICD-10-CM | POA: Diagnosis present

## 2010-10-13 DIAGNOSIS — IMO0001 Reserved for inherently not codable concepts without codable children: Secondary | ICD-10-CM | POA: Diagnosis present

## 2010-10-13 DIAGNOSIS — M6282 Rhabdomyolysis: Secondary | ICD-10-CM

## 2010-10-13 DIAGNOSIS — E860 Dehydration: Principal | ICD-10-CM | POA: Diagnosis present

## 2010-10-13 DIAGNOSIS — N19 Unspecified kidney failure: Secondary | ICD-10-CM

## 2010-10-13 LAB — URINALYSIS, ROUTINE W REFLEX MICROSCOPIC
Glucose, UA: NEGATIVE mg/dL
Ketones, ur: 15 mg/dL — AB
Leukocytes, UA: NEGATIVE
Nitrite: NEGATIVE
Protein, ur: 100 mg/dL — AB
Specific Gravity, Urine: 1.032 — ABNORMAL HIGH (ref 1.005–1.030)
Urobilinogen, UA: 1 mg/dL (ref 0.0–1.0)
pH: 5 (ref 5.0–8.0)

## 2010-10-13 LAB — URINE MICROSCOPIC-ADD ON

## 2010-10-13 LAB — POCT I-STAT, CHEM 8
BUN: 29 mg/dL — ABNORMAL HIGH (ref 6–23)
Calcium, Ion: 1.1 mmol/L — ABNORMAL LOW (ref 1.12–1.32)
Chloride: 103 mEq/L (ref 96–112)
Creatinine, Ser: 2.7 mg/dL — ABNORMAL HIGH (ref 0.50–1.35)
Glucose, Bld: 97 mg/dL (ref 70–99)
HCT: 55 % — ABNORMAL HIGH (ref 39.0–52.0)
Hemoglobin: 18.7 g/dL — ABNORMAL HIGH (ref 13.0–17.0)
Potassium: 3.9 mEq/L (ref 3.5–5.1)
Sodium: 134 mEq/L — ABNORMAL LOW (ref 135–145)
TCO2: 21 mmol/L (ref 0–100)

## 2010-10-13 LAB — CK: Total CK: 702 U/L — ABNORMAL HIGH (ref 7–232)

## 2010-10-14 LAB — CBC
HCT: 39.3 % (ref 39.0–52.0)
HCT: 43.3 % (ref 39.0–52.0)
Hemoglobin: 14.2 g/dL (ref 13.0–17.0)
Hemoglobin: 15.9 g/dL (ref 13.0–17.0)
MCH: 32.2 pg (ref 26.0–34.0)
MCH: 32.3 pg (ref 26.0–34.0)
MCHC: 36.7 g/dL — ABNORMAL HIGH (ref 30.0–36.0)
MCV: 87.7 fL (ref 78.0–100.0)
MCV: 89.3 fL (ref 78.0–100.0)
Platelets: 169 10*3/uL (ref 150–400)
RBC: 4.4 MIL/uL (ref 4.22–5.81)
RBC: 4.94 MIL/uL (ref 4.22–5.81)
RDW: 12.5 % (ref 11.5–15.5)
WBC: 9.4 10*3/uL (ref 4.0–10.5)

## 2010-10-14 LAB — BASIC METABOLIC PANEL
BUN: 29 mg/dL — ABNORMAL HIGH (ref 6–23)
CO2: 25 mEq/L (ref 19–32)
Calcium: 9.3 mg/dL (ref 8.4–10.5)
Chloride: 96 mEq/L (ref 96–112)
Creatinine, Ser: 2.17 mg/dL — ABNORMAL HIGH (ref 0.50–1.35)
GFR calc Af Amer: 43 mL/min — ABNORMAL LOW (ref >60)
GFR calc non Af Amer: 36 mL/min — ABNORMAL LOW (ref >60)
Glucose, Bld: 97 mg/dL (ref 70–99)
Potassium: 3.3 mEq/L — ABNORMAL LOW (ref 3.5–5.1)
Sodium: 135 mEq/L (ref 135–145)

## 2010-10-14 LAB — COMPREHENSIVE METABOLIC PANEL
Alkaline Phosphatase: 61 U/L (ref 39–117)
BUN: 16 mg/dL (ref 6–23)
CO2: 27 mEq/L (ref 19–32)
Chloride: 103 mEq/L (ref 96–112)
Creatinine, Ser: 1.33 mg/dL (ref 0.50–1.35)
GFR calc non Af Amer: >60 mL/min (ref >60)
Total Bilirubin: 0.6 mg/dL (ref 0.3–1.2)

## 2010-10-14 LAB — DRUGS OF ABUSE SCREEN W/O ALC, ROUTINE URINE
Cocaine Metabolites: NEGATIVE
Methadone: NEGATIVE
Opiate Screen, Urine: POSITIVE — AB
Phencyclidine (PCP): NEGATIVE

## 2010-10-14 LAB — TSH: TSH: 0.809 u[IU]/mL (ref 0.350–4.500)

## 2010-10-14 LAB — CK
Total CK: 624 U/L — ABNORMAL HIGH (ref 7–232)
Total CK: 625 U/L — ABNORMAL HIGH (ref 7–232)

## 2010-10-14 LAB — DIFFERENTIAL
Basophils Absolute: 0 10*3/uL (ref 0.0–0.1)
Basophils Relative: 0 % (ref 0–1)
Eosinophils Absolute: 0.1 10*3/uL (ref 0.0–0.7)
Eosinophils Relative: 1 % (ref 0–5)
Lymphocytes Relative: 32 % (ref 12–46)
Lymphs Abs: 3 10*3/uL (ref 0.7–4.0)
Monocytes Absolute: 1.3 10*3/uL — ABNORMAL HIGH (ref 0.1–1.0)
Monocytes Relative: 14 % — ABNORMAL HIGH (ref 3–12)
Neutro Abs: 5.1 10*3/uL (ref 1.7–7.7)
Neutrophils Relative %: 54 % (ref 43–77)

## 2010-10-14 LAB — MRSA PCR SCREENING: MRSA by PCR: NEGATIVE

## 2010-10-14 LAB — MAGNESIUM: Magnesium: 2.2 mg/dL (ref 1.5–2.5)

## 2010-10-18 LAB — THC (MARIJUANA), URINE, CONFIRMATION: Marijuana, Ur-Confirmation: 45 ng/mL — ABNORMAL HIGH

## 2010-10-18 LAB — OPIATE, QUANTITATIVE, URINE
Hydrocodone: NEGATIVE NG/ML
Oxymorphone: NEGATIVE NG/ML

## 2010-10-19 NOTE — H&P (Signed)
NAMEMarland Oliver  Oliver, Ryan NO.:  [id number]  MEDICAL RECORD NO.:  [id number]  LOCATION:  [facility]                         FACILITY:  [facility]  PHYSICIAN:  Eduard Clos, MDDATE OF BIRTH:  1980/05/02  DATE OF ADMISSION:  10/13/2010 DATE OF DISCHARGE:                             HISTORY & PHYSICAL   PRIMARY CARE PHYSICIAN:  Unassigned.  CHIEF COMPLAINT:  Generalized body ache.  HISTORY OF PRESENT ILLNESS:  A 30 year old male with no significant past medical history presenting complaint of generalized body ache.  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  He came to the ER.  In the ER, the patient was found to have creatinine around 2.1 with baseline creatinine was 1.1 in November 2011 and readmitted for dehydration.  The patient denies having any chest pain or any shortness of breath at this time.  Denies any nausea, vomiting, abdominal pain, dysuria, discharge, diarrhea, any headache or visual symptoms or focal deficit.  PAST MEDICAL HISTORY:  Nothing significant.  PAST SURGICAL HISTORY:  Had forearm surgery after he had an injury.  SOCIAL HISTORY:  The patient smokes cigarettes.  Denies any alcohol or drug abuse.  FAMILY HISTORY:  Positive for diabetes and breast cancer.  REVIEW OF SYSTEMS:  As per the history of present illness nothing else significant.  PHYSICAL EXAMINATION:  GENERAL:  The patient examined at bedside, not in acute distress. VITAL SIGNS:  Blood pressure 150/87, pulses 78, temperature 97.8, respirations 18, O2 sat 96%. HEENT: Anicteric.  No pallor.  No discharge from ears, eyes or mouth. CHEST:  Bilateral air entry present.  No crepitation.  No crepitation. HEART:  S1, S2 heard. ABDOMEN:  Soft, nontender.  Bowel sounds heard. CNS:  The patient is awake, alert, oriented to time, place and person. Moves upper and lower  extremities 5/5. EXTREMITIES:  Peripheral pulses good.  No edema.  LABORATORY DATA:  EKG shows normal sinus rhythm.  Heart rate around 85 beats per minute with nonspecific ST-T changes.  CBC; WBC is 9.4, hemoglobin is 15.9, hematocrit 43.3, platelets 169.  Basic metabolic panel; sodium 135, potassium 3.3, chloride 96, carbon dioxide 25, glucose 97, BUN 29, creatinine 2.1, calcium 9.3, CK is 624.  UA is negative for nitrites and leukocytes, moderate bilirubin, ketones 15, calcium oxalate crystals, WBC is 3-6, RBC 3-6, bacteria few.  ASSESSMENT: 1. Dehydration. 2. Acute renal failure, probably from dehydration. 3. Previous history of recurrent left lower extremity abscess. 4. Ongoing tobacco abuse.  PLAN: 1. At this time, admit patient to medical floor. 2. For his dehydration, acute renal failure at this time, we will     hydrate patient aggressively with normal saline.  We will replace     his potassium orally.  If CK levels are slightly high, I am going     to add a troponin.  We will recheck his labs again around 10 a.m.     after adequate hydration.  If his symptoms improves and his     creatinine and CK levels are showing decreasing trend and patient  possibly could be discharged home.     Eduard Clos, MD     ANK/MEDQ  D:  10/14/2010  T:  10/14/2010  Job:  [id number]  Electronically Signed by Midge Minium MD on 10/19/2010 01:15:34 AM

## 2010-10-25 NOTE — Discharge Summary (Signed)
  NAMEMarland Oliver  ABDON, PETROSKY NO.:  [id number]  MEDICAL RECORD NO.:  [id number]  LOCATION:  3021                         FACILITY:  [facility]  PHYSICIAN:  Mauro Kaufmann, MD         DATE OF BIRTH:  [birth year]  DATE OF ADMISSION:  10/13/2010 DATE OF DISCHARGE:  10/14/2010                              DISCHARGE SUMMARY   PRIMARY CARE PHYSICIAN:  Tinnie Gens A. Tawanna Cooler, MD  ADMISSION DIAGNOSES: 1. Dehydration. 2. Acute renal failure. 3. Ongoing tobacco abuse.  DISCHARGE DIAGNOSES: 1. Dehydration, resolved. 2. Acute renal failure, resolved. 3. Hypokalemia.  The patient will have potassium replaced before     discharge. 4. Muscle aches secondary to dehydration.  PROCEDURES:  Tests performed during the hospital stay include the patient's BUN and creatinine on the day of admission was 29/2.17 and total CK was 624.  Troponin was less than 0.30.  Urinalysis was positive for marijuana and also showed positive proteins.  After the IV fluids, the patient's BUN and creatinine is down to 16/1.33, potassium is 3.4.  BRIEF HISTORY AND PHYSICAL:  A 30 year old male with no significant past medical history, who came with a chief complaint of generalized body aches.  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  The patient was found in the ER to have a creatinine of 2.1 and was admitted for dehydration.  BRIEF HOSPITAL COURSE:  The patient was started on IV normal saline and at this time, his dehydration was resolved.  Creatinine is down to normal.  The patient also is found to have elevated protein in the urine, which is probably secondary to the dehydration.  The patient is to follow up with his primary care physician and have a repeat UA done and also check for proteinuria as outpatient.  The patient follows Dr. Kelle Darting and he will follow up him as outpatient.  The patient also has been  complaining of muscle aches, so he will be given Percocet p.r.n. for the pain.  MEDICATIONS:  The medication on discharge include, 1. Percocet 5/325 one to two tablets p.o. every 4 hours as needed. 2. Follow up Dr. Kelle Darting in one to two weeks.     Mauro Kaufmann, MD     GL/MEDQ  D:  10/14/2010  T:  10/14/2010  Job:  [id number]  cc:   Tinnie Gens A. Tawanna Cooler, MD  Electronically Signed by Mauro Kaufmann  on 10/25/2010 11:45:12 AM

## 2011-02-02 ENCOUNTER — Encounter (HOSPITAL_COMMUNITY): Payer: Self-pay | Admitting: *Deleted

## 2011-02-02 ENCOUNTER — Emergency Department (HOSPITAL_COMMUNITY)
Admission: EM | Admit: 2011-02-02 | Discharge: 2011-02-02 | Disposition: A | Discharge status: Home / Self Care | Payer: Federal, State, Local not specified - PPO | Attending: Emergency Medicine | Admitting: Emergency Medicine

## 2011-02-02 DIAGNOSIS — F17200 Nicotine dependence, unspecified, uncomplicated: Secondary | ICD-10-CM | POA: Insufficient documentation

## 2011-02-02 DIAGNOSIS — IMO0002 Reserved for concepts with insufficient information to code with codable children: Secondary | ICD-10-CM | POA: Insufficient documentation

## 2011-02-02 DIAGNOSIS — K6289 Other specified diseases of anus and rectum: Secondary | ICD-10-CM | POA: Insufficient documentation

## 2011-02-02 DIAGNOSIS — S31831A Laceration without foreign body of anus, initial encounter: Secondary | ICD-10-CM

## 2011-02-02 DIAGNOSIS — X58XXXA Exposure to other specified factors, initial encounter: Secondary | ICD-10-CM | POA: Insufficient documentation

## 2011-02-02 DIAGNOSIS — Z8614 Personal history of Methicillin resistant Staphylococcus aureus infection: Secondary | ICD-10-CM | POA: Insufficient documentation

## 2011-02-02 MED ORDER — HYDROCODONE-ACETAMINOPHEN 5-325 MG PO TABS
1.0000 | ORAL_TABLET | Freq: Once | ORAL | Status: AC
  Administered 2011-02-02: 1 via ORAL
  Filled 2011-02-02: qty 1

## 2011-02-02 MED ORDER — HYDROCODONE-ACETAMINOPHEN 5-500 MG PO TABS: 1.0000 | ORAL_TABLET | Freq: Four times a day (QID) | ORAL | Status: AC | PRN

## 2011-02-02 NOTE — ED Notes (Signed)
Pt states "I was tx'd for a rectal fistula a couple of months ago @ Urgent Care, when I have stool I have blood coming out, it hurts so bad"

## 2011-02-02 NOTE — ED Provider Notes (Signed)
History     CSN: [id number] Arrival date & time: 02/02/2011  8:13 AM   First MD Initiated Contact with Patient 02/02/11 [phone]      Chief Complaint  Patient presents with  . Anal Fissure    HPI  Presents with rectal pain. Patient states that for the last 3 days he has had increased pain in his rectum. 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 States he's had several last one year. He denies constipation. He states he normally has 1-6 bowel movements per day. He denies abdominal pain, fevers, chills. He has noted small amts of blood streaked on stool. There is no history of IBD Crohn's ulcerative colitis. There is no family history that he knows of. He does not have any lesions in his mouth. He denies anal penetration. He denies anal discharge. States he is previously used creams and sitz baths with resolution of his fissures. He has not seen a gastroenterologist regarding his recurrent fissures.  Pt did not have a fistula as noted in nsg notes Dixie Dials, RN 02/02/2011 08:42  Pt states "I was tx'd for a rectal fistula a couple of months ago @ Urgent Care, when I have stool I have blood coming out, it hurts so bad"  Past Medical History  Diagnosis Date  . Staph infection 2006    left leg  . MRSA (methicillin resistant staph aureus) culture positive     History reviewed. No pertinent past surgical history.  No family history on file. No h/o UC/Crohns  History  Substance Use Topics  . Smoking status: Current Everyday Smoker -- 0.5 packs/day  . Smokeless tobacco: Not on file  . Alcohol Use: Yes     occassional    Review of Systems  All other systems reviewed and are negative.   except as noted HPI   Allergies  Review of patient's allergies indicates no known allergies.  Home Medications   Current Outpatient Rx  Name Route Sig Dispense Refill  . HYDROCODONE-ACETAMINOPHEN 7.5-750 MG PO TABS Oral Take 1 tablet by mouth every 8 (eight) hours  as needed.      Marland Kitchen HYDROCODONE-ACETAMINOPHEN 5-500 MG PO TABS Oral Take 1-2 tablets by mouth every 6 (six) hours as needed for pain. 15 tablet 0  . LORAZEPAM 0.5 MG PO TABS  Take one by mouth stat for panic attack       BP 138/98  Pulse 58  Temp(Src) 98.2 F (36.8 C) (Oral)  Resp 18  SpO2 99%  Physical Exam  Nursing note and vitals reviewed. Constitutional: He is oriented to person, place, and time. He appears well-developed and well-nourished. No distress.  HENT:  Head: Atraumatic.  Mouth/Throat: Oropharynx is clear and moist.  Eyes: Conjunctivae are normal. Pupils are equal, round, and reactive to light.  Neck: Neck supple.  Cardiovascular: Normal rate, regular rhythm, normal heart sounds and intact distal pulses.  Exam reveals no gallop and no friction rub.   No murmur heard. Pulmonary/Chest: Effort normal. No respiratory distress. He has no wheezes. He has no rales.  Abdominal: Soft. Bowel sounds are normal. He exhibits no distension. There is no tenderness. There is no rebound and no guarding.  Genitourinary:          Multiple small tears at anal opening. No active bleeding.  Min pain with rectal exam, brown stool  Musculoskeletal: Normal range of motion. He exhibits no edema and no tenderness.  Neurological: He is alert and oriented  to person, place, and time.  Skin: Skin is warm and dry.  Psychiatric: He has a normal mood and affect.    ED Course  Procedures (including critical care time)  Labs Reviewed - No data to display No results found.   1. Tear of anal skin      MDM  The patient has multiple small skin tears at the anus. He denies constipation. H/o frequent stools. No fever, abd pain. No h/o anal penetration per pt. Patient encouraged to restart sitz baths. Analgesia. Likely needs GI f/u. He has been given a referral. PMD f/u. Precautions for return.  Stefano Gaul, MD         Forbes Cellar, MD 02/02/11 [id number]

## 2011-02-20 ENCOUNTER — Encounter (HOSPITAL_COMMUNITY): Payer: Self-pay | Admitting: *Deleted

## 2011-02-20 ENCOUNTER — Emergency Department (HOSPITAL_COMMUNITY)
Admission: EM | Admit: 2011-02-20 | Discharge: 2011-02-20 | Disposition: A | Discharge status: Home / Self Care | Payer: Federal, State, Local not specified - PPO | Attending: Emergency Medicine | Admitting: Emergency Medicine

## 2011-02-20 DIAGNOSIS — F17200 Nicotine dependence, unspecified, uncomplicated: Secondary | ICD-10-CM | POA: Insufficient documentation

## 2011-02-20 DIAGNOSIS — K602 Anal fissure, unspecified: Secondary | ICD-10-CM

## 2011-02-20 MED ORDER — HYDROCODONE-ACETAMINOPHEN 10-500 MG PO TABS: 1.0000 | ORAL_TABLET | Freq: Four times a day (QID) | ORAL | Status: AC | PRN

## 2011-02-20 MED ORDER — HYDROCODONE-ACETAMINOPHEN 5-325 MG PO TABS
2.0000 | ORAL_TABLET | Freq: Once | ORAL | Status: AC
  Administered 2011-02-20: 2 via ORAL
  Filled 2011-02-20: qty 2

## 2011-02-20 NOTE — ED Provider Notes (Signed)
History     CSN: [id number] Arrival date & time: 02/20/2011  1:08 AM   First MD Initiated Contact with Patient 02/20/11 [phone]      Chief Complaint  Patient presents with  . Rectal Bleeding    HPI  History provided by the patient. Patient presents with complaints of persistent rectal pain and occasional episodes of bleeding from previously diagnosed anal fissure. Patient states that he's been dealing with this problem off and on for the past year or longer. Patient has intermittent bleeding from site of primary blood.  He reports recently being seen at urgent care earlier this month. Patient has also been seen by GI specialist and states that he has been given a cream to put over the area and prescription for stool softeners. Pain was much more severe all day today and patient complains of inability to fall asleep tonight. Patient denies feeling lightheaded, dizzy, near syncope. Patient denies fever, chills, sweats, nausea vomiting. Patient has no other significant past medical history.   Past Medical History  Diagnosis Date  . Staph infection 2006    left leg  . MRSA (methicillin resistant staph aureus) culture positive     History reviewed. No pertinent past surgical history.  History reviewed. No pertinent family history.  History  Substance Use Topics  . Smoking status: Current Everyday Smoker -- 0.5 packs/day  . Smokeless tobacco: Not on file  . Alcohol Use: Yes     occassional      Review of Systems  Constitutional: Negative for fever and chills.  Gastrointestinal: Positive for anal bleeding and rectal pain. Negative for nausea, vomiting, abdominal pain, diarrhea and constipation.  All other systems reviewed and are negative.    Allergies  Review of patient's allergies indicates no known allergies.  Home Medications   Current Outpatient Rx  Name Route Sig Dispense Refill  . HYDROCODONE-ACETAMINOPHEN 7.5-750 MG PO TABS Oral Take 1 tablet by mouth every 8  (eight) hours as needed.      Marland Kitchen LORAZEPAM 0.5 MG PO TABS  Take one by mouth stat for panic attack       BP 129/79  Pulse 61  Temp(Src) 97.7 F (36.5 C) (Oral)  Resp 19  SpO2 100%  Physical Exam  Nursing note and vitals reviewed. Constitutional: He is oriented to person, place, and time. He appears well-developed and well-nourished. No distress.  HENT:  Head: Normocephalic.  Cardiovascular: Normal rate, regular rhythm and normal heart sounds.   Pulmonary/Chest: Effort normal and breath sounds normal.  Abdominal: Soft. Bowel sounds are normal. He exhibits no distension. There is no tenderness. There is no rebound and no guarding.  Genitourinary:          Left rectal fissure without active bleeding. Area very tender to palpation.  Neurological: He is alert and oriented to person, place, and time.  Skin: Skin is warm. No rash noted.  Psychiatric: He has a normal mood and affect. His behavior is normal.    ED Course  Procedures (including critical care time)   1. RECTAL FISSURE      MDM  2:30 AM patient seen and evaluated. Patient in no acute distress.        Angus Seller, [place] 02/20/11 [phone]

## 2011-02-20 NOTE — ED Provider Notes (Signed)
Medical screening examination/treatment/procedure(s) were performed by non-physician practitioner and as supervising physician I was immediately available for consultation/collaboration.  Nicholes Stairs, MD 02/20/11 2303

## 2011-02-20 NOTE — ED Notes (Signed)
Pt states that he is experiencing rectal bleeding accompanied by pain that keeps him awake and "is miserable".  Pt unable to describe the amount of blood but states it is red in color.  Pt notices the blood most when he has a bowel movement and states it hurts really bad when he uses the bathroom.  Pt states he has never been diagnosed with hemorrhoids.

## 2012-12-03 ENCOUNTER — Emergency Department (HOSPITAL_COMMUNITY)
Admission: EM | Admit: 2012-12-03 | Discharge: 2012-12-03 | Disposition: A | Discharge status: Home / Self Care | Payer: Federal, State, Local not specified - PPO | Source: Home / Self Care | Attending: Family Medicine | Admitting: Family Medicine

## 2012-12-03 ENCOUNTER — Encounter (HOSPITAL_COMMUNITY): Payer: Self-pay | Admitting: Emergency Medicine

## 2012-12-03 DIAGNOSIS — M7711 Lateral epicondylitis, right elbow: Secondary | ICD-10-CM

## 2012-12-03 DIAGNOSIS — M7710 Lateral epicondylitis, unspecified elbow: Secondary | ICD-10-CM

## 2012-12-03 MED ORDER — METHYLPREDNISOLONE 4 MG PO KIT: PACK | ORAL | Status: DC

## 2012-12-03 MED ORDER — TRIAMCINOLONE ACETONIDE 40 MG/ML IJ SUSP
INTRAMUSCULAR | Status: AC
  Filled 2012-12-03: qty 1

## 2012-12-03 MED ORDER — TRIAMCINOLONE ACETONIDE 40 MG/ML IJ SUSP
40.0000 mg | Freq: Once | INTRAMUSCULAR | Status: AC
  Administered 2012-12-03: 40 mg via INTRAMUSCULAR

## 2012-12-03 NOTE — ED Notes (Signed)
C/o of right arm pain x 1 month gradually getting worse. Denies injury. \ Pt states with extending arm has severe pain in elbow. With grasping shooting pain up through arm. Mild swelling in arm at night. Pt has tried ibuprofen and Vicodin for pain with no relief.

## 2012-12-03 NOTE — ED Notes (Signed)
Sling applied by this Clinical research associate.

## 2012-12-03 NOTE — ED Provider Notes (Signed)
CSN: [id number]     Arrival date & time 12/03/12  4696 History   First MD Initiated Contact with Patient 12/03/12 [phone]     Chief Complaint  Patient presents with  . Arm Pain    right arm pain gradually getting worse. onset about a month ago.    (Consider location/radiation/quality/duration/timing/severity/associated sxs/prior Treatment) Patient is a 32 y.o. male presenting with arm pain. The history is provided by the patient.  Arm Pain This is a new problem. The current episode started more than 1 week ago (1 mo of worsening pain in lat elbow.). The problem has been gradually worsening. Exacerbated by: lifting steel on job.    Past Medical History  Diagnosis Date  . Staph infection 2006    left leg  . MRSA (methicillin resistant staph aureus) culture positive    History reviewed. No pertinent past surgical history. History reviewed. No pertinent family history. History  Substance Use Topics  . Smoking status: Current Every Day Smoker -- 0.50 packs/day  . Smokeless tobacco: Not on file  . Alcohol Use: Yes     Comment: occassional    Review of Systems  Constitutional: Negative.   Musculoskeletal: Positive for joint swelling. Negative for back pain.  Skin: Negative.     Allergies  Review of patient's allergies indicates no known allergies.  Home Medications   Current Outpatient Rx  Name  Route  Sig  Dispense  Refill  . LORazepam (ATIVAN) 0.5 MG tablet      Take one by mouth stat for panic attack          . methylPREDNISolone (MEDROL DOSEPAK) 4 MG tablet      follow package directions, start on wed, take until finished   21 tablet   0    BP 139/91  Pulse 66  Temp(Src) 97 F (36.1 C) (Oral)  Resp 14  SpO2 100% Physical Exam  Nursing note and vitals reviewed. Constitutional: He is oriented to person, place, and time. He appears well-developed and well-nourished.  Musculoskeletal: He exhibits tenderness.       Right elbow: He exhibits decreased range of  motion and swelling. Tenderness found. Radial head and lateral epicondyle tenderness noted.       Arms: Neurological: He is alert and oriented to person, place, and time.  Skin: Skin is warm and dry.    ED Course  Procedures (including critical care time) Labs Review Labs Reviewed - No data to display Imaging Review No results found.  MDM      Linna Hoff, MD 12/03/12 1021

## 2012-12-23 ENCOUNTER — Ambulatory Visit: Payer: Federal, State, Local not specified - PPO | Admitting: Internal Medicine

## 2012-12-23 DIAGNOSIS — Z0289 Encounter for other administrative examinations: Secondary | ICD-10-CM

## 2013-01-13 ENCOUNTER — Emergency Department (HOSPITAL_COMMUNITY): Payer: Federal, State, Local not specified - PPO

## 2013-01-13 ENCOUNTER — Emergency Department (HOSPITAL_COMMUNITY)
Admission: EM | Admit: 2013-01-13 | Discharge: 2013-01-14 | Disposition: A | Discharge status: Other Acute Inpatient Hospital | Payer: Federal, State, Local not specified - PPO | Attending: Emergency Medicine | Admitting: Emergency Medicine

## 2013-01-13 ENCOUNTER — Encounter (HOSPITAL_COMMUNITY): Payer: Self-pay | Admitting: Emergency Medicine

## 2013-01-13 DIAGNOSIS — F22 Delusional disorders: Secondary | ICD-10-CM | POA: Diagnosis present

## 2013-01-13 DIAGNOSIS — Z8614 Personal history of Methicillin resistant Staphylococcus aureus infection: Secondary | ICD-10-CM | POA: Insufficient documentation

## 2013-01-13 DIAGNOSIS — F911 Conduct disorder, childhood-onset type: Secondary | ICD-10-CM | POA: Insufficient documentation

## 2013-01-13 DIAGNOSIS — F17200 Nicotine dependence, unspecified, uncomplicated: Secondary | ICD-10-CM | POA: Insufficient documentation

## 2013-01-13 DIAGNOSIS — S0990XA Unspecified injury of head, initial encounter: Secondary | ICD-10-CM | POA: Insufficient documentation

## 2013-01-13 DIAGNOSIS — IMO0002 Reserved for concepts with insufficient information to code with codable children: Secondary | ICD-10-CM | POA: Insufficient documentation

## 2013-01-13 DIAGNOSIS — F1210 Cannabis abuse, uncomplicated: Secondary | ICD-10-CM | POA: Insufficient documentation

## 2013-01-13 DIAGNOSIS — F1994 Other psychoactive substance use, unspecified with psychoactive substance-induced mood disorder: Secondary | ICD-10-CM | POA: Diagnosis present

## 2013-01-13 DIAGNOSIS — F1010 Alcohol abuse, uncomplicated: Secondary | ICD-10-CM | POA: Insufficient documentation

## 2013-01-13 DIAGNOSIS — F10988 Alcohol use, unspecified with other alcohol-induced disorder: Secondary | ICD-10-CM | POA: Insufficient documentation

## 2013-01-13 DIAGNOSIS — Z79899 Other long term (current) drug therapy: Secondary | ICD-10-CM | POA: Insufficient documentation

## 2013-01-13 LAB — CBC
Hemoglobin: 15.3 g/dL (ref 13.0–17.0)
MCH: 32.6 pg (ref 26.0–34.0)
MCHC: 35.3 g/dL (ref 30.0–36.0)
Platelets: 167 10*3/uL (ref 150–400)
RDW: 12.9 % (ref 11.5–15.5)

## 2013-01-13 LAB — COMPREHENSIVE METABOLIC PANEL
ALT: 16 U/L (ref 0–53)
AST: 16 U/L (ref 0–37)
Albumin: 4.3 g/dL (ref 3.5–5.2)
Alkaline Phosphatase: 54 U/L (ref 39–117)
Calcium: 9.3 mg/dL (ref 8.4–10.5)
Glucose, Bld: 96 mg/dL (ref 70–99)
Potassium: 3.9 mEq/L (ref 3.5–5.1)
Sodium: 137 mEq/L (ref 135–145)
Total Protein: 7.4 g/dL (ref 6.0–8.3)

## 2013-01-13 LAB — RAPID URINE DRUG SCREEN, HOSP PERFORMED
Amphetamines: POSITIVE — AB
Barbiturates: NOT DETECTED
Benzodiazepines: NOT DETECTED
Cocaine: NOT DETECTED
Opiates: NOT DETECTED
Tetrahydrocannabinol: POSITIVE — AB

## 2013-01-13 LAB — ACETAMINOPHEN LEVEL: Acetaminophen (Tylenol), Serum: <15 ug/mL (ref 10–30)

## 2013-01-13 MED ORDER — NICOTINE 21 MG/24HR TD PT24
21.0000 mg | MEDICATED_PATCH | Freq: Every day | TRANSDERMAL | Status: DC
  Administered 2013-01-13 – 2013-01-14 (×2): 21 mg via TRANSDERMAL
  Filled 2013-01-13 (×2): qty 1

## 2013-01-13 MED ORDER — HALOPERIDOL LACTATE 5 MG/ML IJ SOLN
INTRAMUSCULAR | Status: AC
  Filled 2013-01-13: qty 1

## 2013-01-13 MED ORDER — ALPRAZOLAM 1 MG PO TABS
1.0000 mg | ORAL_TABLET | Freq: Four times a day (QID) | ORAL | Status: DC | PRN
  Administered 2013-01-13 – 2013-01-14 (×2): 1 mg via ORAL
  Filled 2013-01-13: qty 1

## 2013-01-13 MED ORDER — HALOPERIDOL LACTATE 5 MG/ML IJ SOLN
5.0000 mg | Freq: Once | INTRAMUSCULAR | Status: DC
  Filled 2013-01-13: qty 1

## 2013-01-13 MED ORDER — LORAZEPAM 2 MG/ML IJ SOLN
INTRAMUSCULAR | Status: AC
  Filled 2013-01-13: qty 1

## 2013-01-13 MED ORDER — DIPHENHYDRAMINE HCL 50 MG/ML IJ SOLN
INTRAMUSCULAR | Status: AC
  Filled 2013-01-13: qty 1

## 2013-01-13 MED ORDER — OLANZAPINE 5 MG PO TBDP
5.0000 mg | ORAL_TABLET | Freq: Every day | ORAL | Status: DC
  Administered 2013-01-13: 5 mg via ORAL
  Filled 2013-01-13: qty 1

## 2013-01-13 NOTE — ED Notes (Signed)
Bed: IE33 Expected date:  Expected time:  Means of arrival:  Comments: [place]
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
## 2013-01-13 NOTE — ED Notes (Signed)
Patient went to restroom 
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
## 2013-01-13 NOTE — ED Notes (Signed)
Spoke with PA, pt to be IVC. Security informed.
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
## 2013-01-13 NOTE — ED Notes (Signed)
Social Worker Shenita eval pt at present.

## 2013-01-13 NOTE — ED Notes (Signed)
PA at bedside.

## 2013-01-13 NOTE — ED Notes (Addendum)
Family at bedside reports that pt is hallucinating. Pt states he feels paranoid.
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
## 2013-01-13 NOTE — ED Notes (Signed)
pts wife Mortimer Bair   [phone] pts brother  [phone]  All pts belongings taken with family

## 2013-01-13 NOTE — Progress Notes (Deleted)
   CARE MANAGEMENT ED NOTE 01/13/2013  Patient:  [facility]   Account Number:  [id number]  Date Initiated:  01/13/2013  Documentation initiated by:  Edd Arbour  Subjective/Objective Assessment:   32 yr old male [facility] [place] ppo patient without pcp listed in EPIC     Subjective/Objective Assessment Detail:   pt appreciative of resources     Action/Plan:   Cm spoke with pt and male at bedside Pt states he has not seen a pcp in 5 years and may have to start going to one rountinely Pt made a joke that he was getting older and "may have to go out in pastures" Male told pt to "stop that"   Action/Plan Detail:   Cm discussed pcp importance Cm provided pt with a list of accepting [facility] [place] ppo providers [place] [id number]  Cm later went back to speak with pt to assess BH/mental status Pt confirms he was joking, Denies no SI, HI ideations   Anticipated DC Date:  01/16/2013     Status Recommendation to Physician:   Result of Recommendation:    Other ED Services  Consult Working Plan    DC Planning Services  Other  Outpatient Services - Pt will follow up  PCP issues    Choice offered to / List presented to:            Status of service:  Completed, signed off  ED Comments:   ED Comments Detail:

## 2013-01-14 ENCOUNTER — Encounter (HOSPITAL_COMMUNITY): Payer: Self-pay | Admitting: Registered Nurse

## 2013-01-14 DIAGNOSIS — F22 Delusional disorders: Secondary | ICD-10-CM | POA: Diagnosis present

## 2013-01-14 DIAGNOSIS — F1010 Alcohol abuse, uncomplicated: Secondary | ICD-10-CM

## 2013-01-14 DIAGNOSIS — F1994 Other psychoactive substance use, unspecified with psychoactive substance-induced mood disorder: Secondary | ICD-10-CM | POA: Diagnosis present

## 2013-01-14 MED ORDER — HALOPERIDOL LACTATE 5 MG/ML IJ SOLN: 5.0000 mg | Freq: Once | INTRAMUSCULAR | Status: DC | PRN

## 2013-01-14 MED ORDER — LORAZEPAM 1 MG PO TABS
1.0000 mg | ORAL_TABLET | Freq: Three times a day (TID) | ORAL | Status: DC | PRN
  Filled 2013-01-14: qty 1

## 2013-01-14 NOTE — ED Notes (Signed)
Report called to Kathie Rhodes, Charity fundraiser at [facility] in [place], [place].  [facility] ready for pt., [place] [employer] called for transportation.

## 2013-01-14 NOTE — BH Assessment (Signed)
Spouse and brother are both here in the building. Shuvon, NP and Dr. Ladona Ridgel will see family to obtain the necessary collateral information.

## 2013-01-14 NOTE — BH Assessment (Signed)
Christiane Ha called and stated that patient is accepted to [facility]. Pt accepted to [facility] by Dr. Forrestine Him. The nursing report # is [phone]. Patient is under IVC and will need to be transported via [place]. Upon arrival patient will need to present to the [employer].

## 2013-01-14 NOTE — ED Notes (Signed)
Pt. D/C'ed at 1250.

## 2013-01-14 NOTE — BH Assessment (Signed)
Patient evaluated by both Denice Bors, NP and Dr. Ladona Ridgel. Contacted patients spouse Lary, Eckardt [phone] for collateral information, per [facility]'s request. Writer left a voicemail for patients spouse asking that she returns call.
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
## 2013-01-14 NOTE — ED Provider Notes (Signed)
Medical screening examination/treatment/procedure(s) were conducted as a shared visit with non-physician practitioner(s) and myself.  I personally evaluated the patient during the encounter  See attached note for details.  Shon Baton, MD 01/14/13 [phone]

## 2013-01-16 NOTE — Consult Note (Signed)
Note reviewed and agreed with  

## 2015-04-28 ENCOUNTER — Telehealth: Payer: Self-pay | Admitting: Behavioral Health

## 2015-04-28 NOTE — Telephone Encounter (Signed)
Unable to reach patient at time of Pre-Visit Call.  Left message for patient to return call when available.    

## 2015-04-29 ENCOUNTER — Telehealth: Payer: Self-pay | Admitting: Family Medicine

## 2015-04-29 ENCOUNTER — Ambulatory Visit: Payer: Self-pay | Admitting: Family Medicine

## 2015-04-30 ENCOUNTER — Encounter: Payer: Self-pay | Admitting: Family Medicine

## 2015-04-30 NOTE — Telephone Encounter (Signed)
charge 

## 2015-04-30 NOTE — Telephone Encounter (Signed)
Pt was no show 04/29/15 3:00pm for new pt appt, pt has not rescheduled, charge or no charge?

## 2015-04-30 NOTE — Telephone Encounter (Signed)
Marked to charge and mailing no show letter °

## 2015-05-28 ENCOUNTER — Telehealth: Payer: Self-pay | Admitting: Behavioral Health

## 2015-05-28 NOTE — Telephone Encounter (Signed)
Unable to reach patient at time of Pre-Visit Call.  Left message for patient to return call when available.    

## 2015-05-31 ENCOUNTER — Ambulatory Visit (INDEPENDENT_AMBULATORY_CARE_PROVIDER_SITE_OTHER): Payer: Federal, State, Local not specified - PPO | Admitting: Family Medicine

## 2015-05-31 ENCOUNTER — Encounter: Payer: Self-pay | Admitting: Family Medicine

## 2015-05-31 VITALS — BP 140/100 | HR 85 | Temp 97.9°F | Ht 72.0 in | Wt 261.2 lb

## 2015-05-31 DIAGNOSIS — IMO0001 Reserved for inherently not codable concepts without codable children: Secondary | ICD-10-CM

## 2015-05-31 DIAGNOSIS — Z130 Encounter for screening for diseases of the blood and blood-forming organs and certain disorders involving the immune mechanism: Secondary | ICD-10-CM

## 2015-05-31 DIAGNOSIS — Z13220 Encounter for screening for lipoid disorders: Secondary | ICD-10-CM

## 2015-05-31 DIAGNOSIS — R7989 Other specified abnormal findings of blood chemistry: Secondary | ICD-10-CM

## 2015-05-31 DIAGNOSIS — R635 Abnormal weight gain: Secondary | ICD-10-CM | POA: Diagnosis not present

## 2015-05-31 DIAGNOSIS — Z23 Encounter for immunization: Secondary | ICD-10-CM | POA: Diagnosis not present

## 2015-05-31 DIAGNOSIS — G4726 Circadian rhythm sleep disorder, shift work type: Secondary | ICD-10-CM

## 2015-05-31 DIAGNOSIS — Z7689 Persons encountering health services in other specified circumstances: Secondary | ICD-10-CM

## 2015-05-31 DIAGNOSIS — Z131 Encounter for screening for diabetes mellitus: Secondary | ICD-10-CM

## 2015-05-31 DIAGNOSIS — R030 Elevated blood-pressure reading, without diagnosis of hypertension: Secondary | ICD-10-CM

## 2015-05-31 DIAGNOSIS — Z720 Tobacco use: Secondary | ICD-10-CM

## 2015-05-31 DIAGNOSIS — Z716 Tobacco abuse counseling: Secondary | ICD-10-CM

## 2015-05-31 DIAGNOSIS — Z7189 Other specified counseling: Secondary | ICD-10-CM

## 2015-05-31 LAB — COMPREHENSIVE METABOLIC PANEL
ALT: 33 U/L (ref 0–53)
AST: 22 U/L (ref 0–37)
Albumin: 4.5 g/dL (ref 3.5–5.2)
Alkaline Phosphatase: 57 U/L (ref 39–117)
BUN: 7 mg/dL (ref 6–23)
CALCIUM: 9.4 mg/dL (ref 8.4–10.5)
CHLORIDE: 103 meq/L (ref 96–112)
CO2: 28 meq/L (ref 19–32)
CREATININE: 1.12 mg/dL (ref 0.40–1.50)
GFR: 95.76 mL/min (ref >60.00)
Glucose, Bld: 86 mg/dL (ref 70–99)
Potassium: 4.1 mEq/L (ref 3.5–5.1)
SODIUM: 140 meq/L (ref 135–145)
Total Bilirubin: 0.5 mg/dL (ref 0.2–1.2)
Total Protein: 7.1 g/dL (ref 6.0–8.3)

## 2015-05-31 LAB — LIPID PANEL
CHOL/HDL RATIO: 6
Cholesterol: 224 mg/dL — ABNORMAL HIGH (ref 0–200)
HDL: 39.2 mg/dL (ref >39.00)
NONHDL: 184.78
TRIGLYCERIDES: 280 mg/dL — AB (ref 0.0–149.0)
VLDL: 56 mg/dL — AB (ref 0.0–40.0)

## 2015-05-31 LAB — HEMOGLOBIN A1C: Hgb A1c MFr Bld: 5.4 % (ref 4.6–6.5)

## 2015-05-31 LAB — CBC
HCT: 45.5 % (ref 39.0–52.0)
Hemoglobin: 15.7 g/dL (ref 13.0–17.0)
MCHC: 34.4 g/dL (ref 30.0–36.0)
MCV: 91.8 fl (ref 78.0–100.0)
PLATELETS: 198 10*3/uL (ref 150.0–400.0)
RBC: 4.96 Mil/uL (ref 4.22–5.81)
RDW: 13.5 % (ref 11.5–15.5)
WBC: 8.8 10*3/uL (ref 4.0–10.5)

## 2015-05-31 LAB — LDL CHOLESTEROL, DIRECT: Direct LDL: 145 mg/dL

## 2015-05-31 LAB — TSH: TSH: 0.97 u[IU]/mL (ref 0.35–4.50)

## 2015-05-31 MED ORDER — VARENICLINE TARTRATE 1 MG PO TABS: 1.0000 mg | ORAL_TABLET | Freq: Two times a day (BID) | ORAL | Status: DC

## 2015-05-31 MED ORDER — VARENICLINE TARTRATE 0.5 MG X 11 & 1 MG X 42 PO MISC: ORAL | Status: DC

## 2015-05-31 MED ORDER — ALPRAZOLAM 0.5 MG PO TABS: ORAL_TABLET | ORAL | Status: DC

## 2015-05-31 NOTE — Progress Notes (Signed)
[facility] [address] [place], [place] [address] [phone]  Date:  05/31/2015   Name:  [name]   DOB:  09-18-1980   MRN:  [id number]  PCP:  Abbe Amsterdam, MD    Chief Complaint: Establish Care   History of Present Illness:  [name] is a 35 y.o. very pleasant male patient who presents with the following:  Generally healthy man here today to establish care. Last labs 2014. His PCP is soon to retire. He is from this area and is a long time [place] pt.    Medical:history of anxiety/ ?adhd  Surgical: right wrist surgery at age 13 Social: former smoker- he quit 2 weeks.  He has used chantix in the past and may want to use this again. He quit smoking when he got sick a few weeks ago.    He is concerned bout weight gain-  Wt Readings from Last 3 Encounters:  05/31/15 261 lb 3.2 oz (118.48 kg)  04/19/10 251 lb (113.853 kg)  03/08/10 242 lb 8 oz (109.997 kg)   He has gained some weight since he went on 3rd shift- he is a Merchandiser, retail.  He went on 3rd about a year ago.  The job is much more stressful than he has done in the past but he is also making more money He has seen Dr. Evelene Croon in the past for anxiety.  He had been on xanax in the past. He has not used this is some time but might like to have some to help with daytime sleep.  He is no longer on adderall.  He does not wish to go back on this.   He works 6-7 days a week- he rarely has a day off.   He is putting together a home gym so he can exercise at home.   He no longer has time to go to a gym He is fasting today for labs.   He is married- his daughters are 60, 33 and 54 yo.    Reviewed controlled sub database- no entries  He denies ever using any steroids (he does have a muscular, body builder type build)  Patient Active Problem List   Diagnosis Date Noted  . Paranoia (HCC) 01/14/2013  . Substance induced mood disorder (HCC) 01/14/2013  .  RECTAL FISSURE 04/19/2010  . PANIC DISORDER,NO AGORAPHOBIA 03/08/2010  . ARTHROPATHY NOS, UNSPECIFIED SITE 03/08/2010  . BACK PAIN 02/14/2010    Past Medical History  Diagnosis Date  . Staph infection 2006    left leg  . MRSA (methicillin resistant staph aureus) culture positive     No past surgical history on file.  Social History  Substance Use Topics  . Smoking status: Former Smoker -- 0.50 packs/day  . Smokeless tobacco: Never Used  . Alcohol Use: 0.0 oz/week    0 Standard drinks or equivalent per week     Comment: occassional    No family history on file.  No Known Allergies  Medication list has been reviewed and updated.  Current Outpatient Prescriptions on File Prior to Visit  Medication Sig Dispense Refill  . ALPRAZolam (XANAX) 1 MG tablet Take 1 mg by mouth 4 (four) times daily as needed for sleep or anxiety. Reported on 05/31/2015    . amphetamine-dextroamphetamine (ADDERALL) 30 MG tablet Take 30 mg by mouth as needed (ADHD). Reported on 05/31/2015     No current facility-administered medications on file prior  to visit.    Review of Systems:  As per HPI- otherwise negative.   Physical Examination: Filed Vitals:   05/31/15 1029  Pulse: 85  Temp: 97.9 F (36.6 C)   Filed Vitals:   05/31/15 1029  Height: 6' (1.829 m)  Weight: 261 lb 3.2 oz (118.48 kg)   Body mass index is 35.42 kg/(m^2). Ideal Body Weight: Weight in (lb) to have BMI = 25: 183.9  GEN: WDWN, NAD, Non-toxic, A & O x 3, overweight and muscular build HEENT: Atraumatic, Normocephalic. Neck supple. No masses, No LAD. Ears and Nose: No external deformity. CV: RRR, No M/G/R. No JVD. No thrill. No extra heart sounds. PULM: CTA B, no wheezes, crackles, rhonchi. No retractions. No resp. distress. No accessory muscle use. EXTR: No c/c/e NEURO Normal gait.  PSYCH: Normally interactive. Conversant. Not depressed or anxious appearing.  Calm demeanor.    Assessment and Plan: Establishing care  with new doctor, encounter for  Shift work sleep disorder - Plan: ALPRAZolam (XANAX) 0.5 MG tablet  Weight gain - Plan: TSH  Screening for hyperlipidemia - Plan: Lipid panel  Screening for diabetes mellitus - Plan: Comprehensive metabolic panel, Hemoglobin A1c  Screening for deficiency anemia - Plan: CBC  Immunization due - Plan: Tdap vaccine greater than or equal to 7yo IM  Elevated BP  Encounter for smoking cessation counseling - Plan: varenicline (CHANTIX STARTING MONTH PAK) 0.5 MG X 11 & 1 MG X 42 tablet, varenicline (CHANTIX CONTINUING MONTH PAK) 1 MG tablet  Here today to establish care.  He has taken a job that pays well but is stressful and required him to work 3rd shift.  These changes likely account for his weight gain. Discussed with him in detail- he will work on his diet and exercise rx for xanax to use for sleep/ shift work disorder tdap updated He will monitor his BP and let me know how it looks at work/ home He quit smoking after recent bout with bronchitis but would like a chantix rx in case he needs it- this is fine  Meds ordered this encounter  Medications  . varenicline (CHANTIX STARTING MONTH PAK) 0.5 MG X 11 & 1 MG X 42 tablet    Sig: Take one 0.5 mg tablet by mouth once daily for 3 days, then increase to one 0.5 mg tablet twice daily for 4 days, then increase to one 1 mg tablet twice daily.    Dispense:  53 tablet    Refill:  0  . varenicline (CHANTIX CONTINUING MONTH PAK) 1 MG tablet    Sig: Take 1 tablet (1 mg total) by mouth 2 (two) times daily.    Dispense:  60 tablet    Refill:  2  . ALPRAZolam (XANAX) 0.5 MG tablet    Sig: Take 1/2 or 1 at bedtime as needed for sleep    Dispense:  30 tablet    Refill:  1     Signed Abbe AmsterdamJessica Dorethia Jeanmarie, MD

## 2015-05-31 NOTE — Patient Instructions (Addendum)
It was good to see you today- I will be in touch with your labs asap Work on getting better sleep, and you may use the xanax as needed Remember this is sedating and can be habit forming.  Do not drive on this mediation or combine with alcohol  Work on a diet with lots of fruits, vegetables and lean protein.  Exercise is also helpful!  Let's plan to recheck in 3 months Please check your BP at work and let me know if you are generally running over 140/90

## 2015-05-31 NOTE — Progress Notes (Signed)
Pre visit review using our clinic review tool, if applicable. No additional management support is needed unless otherwise documented below in the visit note. 

## 2015-06-01 ENCOUNTER — Encounter: Payer: Self-pay | Admitting: Family Medicine

## 2015-06-11 ENCOUNTER — Telehealth: Payer: Self-pay

## 2015-06-11 NOTE — Telephone Encounter (Signed)
Patient want to let you know that he is now taking two xanax and will need a refill pretty soon. He just wanted to let you know that he had moved to 2 pills to help him sleep.

## 2015-06-14 NOTE — Telephone Encounter (Signed)
noted 

## 2015-06-16 ENCOUNTER — Telehealth: Payer: Self-pay | Admitting: Family Medicine

## 2015-06-16 DIAGNOSIS — G4726 Circadian rhythm sleep disorder, shift work type: Secondary | ICD-10-CM

## 2015-06-16 MED ORDER — ALPRAZOLAM 0.5 MG PO TABS: ORAL_TABLET | ORAL | Status: DC

## 2015-06-16 NOTE — Telephone Encounter (Signed)
Caller name: Self  Can be reached: [phone]  Pharmacy:  [facility] [id number] [place] , [place] - [id number] [facility] [address] [phone] (Fax)         Reason for call: Patient states he is taking 2 Xanax per day and his rx will run out before the end of the month.

## 2015-06-17 NOTE — Telephone Encounter (Signed)
This is taken care of  Meds ordered this encounter  Medications  . ALPRAZolam (XANAX) 0.5 MG tablet    Sig: Take 1 or 2 daily at bedtime as needed for sleep    Dispense:  60 tablet    Refill:  1

## 2015-07-20 ENCOUNTER — Telehealth: Payer: Self-pay | Admitting: Family Medicine

## 2015-07-20 NOTE — Telephone Encounter (Signed)
Patient Name: Judyann MunsonDWARD Metts  DOB: 1980/12/30    Initial Comment caller states husband was prescribed Xanax and is now having psychosis, hallucinations and paranoid delusions. Threatening to harm others   Nurse Assessment  Nurse: Renaldo FiddlerAdkins, RN, Raynelle FanningJulie Date/Time Lamount Cohen(Eastern Time): 07/20/2015 11:40:29 AM  Confirm and document reason for call. If symptomatic, describe symptoms. You must click the next button to save text entered. ---caller states husband was prescribed Xanax and is now having psychosis, He is hallucinating and having paranoid delusions. The last time she spoke to him this morning he was driving with his mother. " She has no idea where he is now, he threw his phone away". States everything that is happening is "not real". She is not with him , but knows he can not take any medication. She is asking if there is any medication that " can help him today ".  Has the patient traveled out of the country within the last 30 days? ---Not Applicable  Does the patient have any new or worsening symptoms? ---Yes  Will a triage be completed? ---Yes  Related visit to physician within the last 2 weeks? ---No  Does the PT have any chronic conditions? (i.e. diabetes, asthma, etc.) ---Yes  List chronic conditions. ---ADD, Anxiety/Insomnia/Depression  Is this a behavioral health or substance abuse call? ---Yes  Are you having any thoughts or feelings of harming or killing yourself or someone else? ---No  Are you currently experiencing any physical discomfort that you think may be related to the use of alcohol or other drugs? (use substance abuse or alcohol abuse guidelines. These include withdrawal symptoms) ---No  Do you worry that you may be hearing or seeing things that others do not? ---No  Do you take medications for your condition(s)? ---No     Guidelines    Guideline Title Affirmed Question Affirmed Notes  Depression [1] Depression AND [2] unable to do any of normal activities (e.g., self care,  school, work; in comparison to baseline).    Final Disposition User   Go to ED Now Renaldo FiddlerAdkins, RN, Raynelle FanningJulie    Referrals  GO TO FACILITY UNDECIDED   Disagree/Comply: Comply

## 2015-07-20 NOTE — Telephone Encounter (Signed)
To PCP for review. See [facility] note.

## 2015-07-20 NOTE — Telephone Encounter (Signed)
Follow up w/ pt's wife, Dawn. She states that pt does not have his phone and she has not been able to contact him. She did not have a phone number where he could be reached. She states he might be with his mother in law. She is planning to go to the house when she gets off work at 4:50 pm and take him to [facility]. Advised that based on symptoms, pt should be seen in ED ASAP and that she could call 911 to transport him to ED if she is unavailable. She declined 911, stating she "didn't want to have to resort to that."

## 2015-07-20 NOTE — Telephone Encounter (Signed)
Message routed to PCP for FYI.   

## 2015-07-20 NOTE — Telephone Encounter (Signed)
Caller name: Dawn Relationship to patient: wife  Can be reached: [phone]   Reason for call: Pt wife is calling in wanting to talk to Dr. Patsy Lageropland to call her in regard to pt using Xanax. She states pt has had "mental issues" when taking this type of medication and she is aware pt is new to Dr. Patsy Lageropland. She wanted to give her some information as this has happened to pt before. She states pt will not discontinue use of med. Pt is having hallucinations and stating people are after him. He has been involuntarily committed before due to similar issues (from taking adderall). At end of conversation she states she feels pt will harm himself or someone else. [facility].

## 2015-08-02 NOTE — Telephone Encounter (Signed)
I do not see where he went to the ER.  Have not been able to reach the pt. Called his wife today and LMOM- I hope that all is well, let me know if I can help in any way.

## 2015-08-17 ENCOUNTER — Encounter (HOSPITAL_COMMUNITY): Payer: Self-pay | Admitting: Emergency Medicine

## 2015-08-17 ENCOUNTER — Emergency Department (HOSPITAL_COMMUNITY)
Admission: EM | Admit: 2015-08-17 | Discharge: 2015-08-19 | Disposition: A | Discharge status: Other Acute Inpatient Hospital | Payer: Federal, State, Local not specified - PPO | Attending: Emergency Medicine | Admitting: Emergency Medicine

## 2015-08-17 DIAGNOSIS — R45851 Suicidal ideations: Secondary | ICD-10-CM | POA: Insufficient documentation

## 2015-08-17 DIAGNOSIS — Z87891 Personal history of nicotine dependence: Secondary | ICD-10-CM | POA: Insufficient documentation

## 2015-08-17 DIAGNOSIS — Z79899 Other long term (current) drug therapy: Secondary | ICD-10-CM | POA: Insufficient documentation

## 2015-08-17 DIAGNOSIS — F1910 Other psychoactive substance abuse, uncomplicated: Secondary | ICD-10-CM

## 2015-08-17 DIAGNOSIS — R45850 Homicidal ideations: Secondary | ICD-10-CM | POA: Insufficient documentation

## 2015-08-17 DIAGNOSIS — F1410 Cocaine abuse, uncomplicated: Secondary | ICD-10-CM | POA: Insufficient documentation

## 2015-08-17 DIAGNOSIS — F22 Delusional disorders: Secondary | ICD-10-CM | POA: Diagnosis present

## 2015-08-17 LAB — CBC
HCT: 42.1 % (ref 39.0–52.0)
HEMOGLOBIN: 15.4 g/dL (ref 13.0–17.0)
MCH: 32.6 pg (ref 26.0–34.0)
MCHC: 36.6 g/dL — ABNORMAL HIGH (ref 30.0–36.0)
MCV: 89 fL (ref 78.0–100.0)
Platelets: 180 10*3/uL (ref 150–400)
RBC: 4.73 MIL/uL (ref 4.22–5.81)
RDW: 12.6 % (ref 11.5–15.5)
WBC: 12.1 10*3/uL — ABNORMAL HIGH (ref 4.0–10.5)

## 2015-08-17 LAB — COMPREHENSIVE METABOLIC PANEL
ALT: 26 U/L (ref 17–63)
AST: 29 U/L (ref 15–41)
Albumin: 4.6 g/dL (ref 3.5–5.0)
Alkaline Phosphatase: 52 U/L (ref 38–126)
Anion gap: 9 (ref 5–15)
BUN: 14 mg/dL (ref 6–20)
CHLORIDE: 102 mmol/L (ref 101–111)
CO2: 25 mmol/L (ref 22–32)
CREATININE: 1.38 mg/dL — AB (ref 0.61–1.24)
Calcium: 9.1 mg/dL (ref 8.9–10.3)
GFR calc Af Amer: >60 mL/min (ref >60)
GFR calc non Af Amer: >60 mL/min (ref >60)
Glucose, Bld: 82 mg/dL (ref 65–99)
Potassium: 3.4 mmol/L — ABNORMAL LOW (ref 3.5–5.1)
SODIUM: 136 mmol/L (ref 135–145)
Total Bilirubin: 0.9 mg/dL (ref 0.3–1.2)
Total Protein: 7.5 g/dL (ref 6.5–8.1)

## 2015-08-17 LAB — ACETAMINOPHEN LEVEL: Acetaminophen (Tylenol), Serum: <10 ug/mL — ABNORMAL LOW (ref 10–30)

## 2015-08-17 LAB — ETHANOL: Alcohol, Ethyl (B): <5 mg/dL (ref <5)

## 2015-08-17 LAB — SALICYLATE LEVEL: Salicylate Lvl: <4 mg/dL (ref 2.8–30.0)

## 2015-08-17 MED ORDER — ZOLPIDEM TARTRATE 5 MG PO TABS: 5.0000 mg | ORAL_TABLET | Freq: Every evening | ORAL | Status: DC | PRN

## 2015-08-17 MED ORDER — ALUM & MAG HYDROXIDE-SIMETH 200-200-20 MG/5ML PO SUSP: 30.0000 mL | ORAL | Status: DC | PRN

## 2015-08-17 MED ORDER — NICOTINE 21 MG/24HR TD PT24
21.0000 mg | MEDICATED_PATCH | Freq: Every day | TRANSDERMAL | Status: DC | PRN
  Administered 2015-08-17 – 2015-08-19 (×2): 21 mg via TRANSDERMAL
  Filled 2015-08-17 (×2): qty 1

## 2015-08-17 MED ORDER — ONDANSETRON HCL 4 MG PO TABS: 4.0000 mg | ORAL_TABLET | Freq: Three times a day (TID) | ORAL | Status: DC | PRN

## 2015-08-17 MED ORDER — IBUPROFEN 200 MG PO TABS: 600.0000 mg | ORAL_TABLET | Freq: Three times a day (TID) | ORAL | Status: DC | PRN

## 2015-08-17 MED ORDER — ACETAMINOPHEN 325 MG PO TABS: 650.0000 mg | ORAL_TABLET | ORAL | Status: DC | PRN

## 2015-08-17 MED ORDER — OLANZAPINE 5 MG PO TABS
7.5000 mg | ORAL_TABLET | Freq: Two times a day (BID) | ORAL | Status: DC
  Administered 2015-08-18 – 2015-08-19 (×2): 7.5 mg via ORAL
  Filled 2015-08-17 (×6): qty 1

## 2015-08-17 MED ORDER — CLONAZEPAM 1 MG PO TABS
1.0000 mg | ORAL_TABLET | Freq: Two times a day (BID) | ORAL | Status: DC
Start: 2015-08-17 — End: 2015-08-19
  Administered 2015-08-17 – 2015-08-19 (×4): 1 mg via ORAL
  Filled 2015-08-17: qty 2
  Filled 2015-08-17 (×3): qty 1

## 2015-08-17 NOTE — ED Notes (Signed)
Pt brought in by [employer] under IVC  Pt states he told her he wanted a divorce today and she called him and asked him to come over and when he got there the police were waiting for him with IVC paperwork  Pt states his wife used to be a Therapist, occupationalmagistrate so this was easy for her   Paperwork states the pt was diagnosed with delusional disorder and was prescribed zyprexa and klonopin which he refuses to take   The respondent was recently released from [facility]  Pt is extremely paranoid and carried a firearm everywhere  The respondent fears family members and the plaintiff is trying to kill him   The respondent fears that he will be killed at his job and so refuses to go back to his job  The respondent reports seeing guns being pointed at him and says people are coming up to him telling him his family is plotting to kill him   The respondent has threatened to kill himself and others   The respondent cocked his pistol twice at family members  The respondent is using marijuana

## 2015-08-17 NOTE — ED Provider Notes (Signed)
CSN: [id number]     Arrival date & time 08/17/15  2105 History   First MD Initiated Contact with Patient 08/17/15 2224     Chief Complaint  Patient presents with  . IVC      (Consider location/radiation/quality/duration/timing/severity/associated sxs/prior Treatment) HPI   [name] is a 35 y.o. male brought in by his wife, or paranoia, homicidal ideation, suicidal ideation, not taking his medications, and continually obsessing about multiple persons, both known and unknown, trying to kill him. He continues to abuse marijuana, and not take his Zyprexa which was prescribed. He requested "a higher dose of Klonopin." His wife filled out commitment papers, and he was brought in by police officers. The apparently they disarmed him prior to bringing him here. His wife thinks he has at least one other weapon, hidden at home. There is no report of suicide attempt, or physical harm to anyone. However, he has started to kill his wife.  Level V caveat- delusional   Past Medical History  Diagnosis Date  . Staph infection 2006    left leg  . MRSA (methicillin resistant staph aureus) culture positive   . Anxiety   . Heart murmur    Past Surgical History  Procedure Laterality Date  . Wrist tenodesis      Right arm  . Leg surgery      MRSA removed   Family History  Problem Relation Age of Onset  . Diabetes Father   . Glaucoma Father    Social History  Substance Use Topics  . Smoking status: Former Smoker -- 0.50 packs/day  . Smokeless tobacco: Never Used  . Alcohol Use: 0.0 oz/week    0 Standard drinks or equivalent per week     Comment: occassional    Review of Systems  Unable to perform ROS: Psychiatric disorder      Allergies  Review of patient's allergies indicates no known allergies.  Home Medications   Prior to Admission medications   Medication Sig Start Date End Date Taking? Authorizing Provider  clonazePAM (KLONOPIN) 1 MG tablet Take 1 mg by mouth 2 (two)  times daily. 08/03/15  Yes Historical Provider, MD  OLANZapine (ZYPREXA) 7.5 MG tablet Take 7.5 mg by mouth 2 (two) times daily. 08/03/15  Yes Historical Provider, MD  ALPRAZolam Prudy Feeler) 0.5 MG tablet Take 1 or 2 daily at bedtime as needed for sleep Patient not taking: Reported on 08/17/2015 06/16/15   Pearline Cables, MD  varenicline (CHANTIX CONTINUING MONTH PAK) 1 MG tablet Take 1 tablet (1 mg total) by mouth 2 (two) times daily. Patient not taking: Reported on 08/17/2015 05/31/15   Pearline Cables, MD  varenicline (CHANTIX STARTING MONTH PAK) 0.5 MG X 11 & 1 MG X 42 tablet Take one 0.5 mg tablet by mouth once daily for 3 days, then increase to one 0.5 mg tablet twice daily for 4 days, then increase to one 1 mg tablet twice daily. Patient not taking: Reported on 08/17/2015 05/31/15   Gwenlyn Found Copland, MD   BP 151/102 mmHg  Pulse 83  Temp(Src) 98.1 F (36.7 C) (Oral)  Resp 20  Ht 6' (1.829 m)  Wt 245 lb (111.131 kg)  BMI 33.22 kg/m2  SpO2 98% Physical Exam  Constitutional: He appears well-developed and well-nourished.  HENT:  Head: Normocephalic and atraumatic.  Right Ear: External ear normal.  Left Ear: External ear normal.  Eyes: Conjunctivae and EOM are normal. Pupils are equal, round, and reactive to light.  Neck: Normal  range of motion and phonation normal. Neck supple.  Cardiovascular: Normal rate.   Pulmonary/Chest: Effort normal. He exhibits no bony tenderness.  Abdominal: There is tenderness.  Musculoskeletal: Normal range of motion.  Neurological: He is alert. No cranial nerve deficit or sensory deficit. He exhibits normal muscle tone. Coordination normal.  Oriented to person and place. No dysarthria  Skin: Skin is warm, dry and intact.  Psychiatric:  Suspicious, tangential thought process.  Nursing note and vitals reviewed.   ED Course  Procedures (including critical care time)  Initial clinical impression- paranoia, delusion, and medication noncompliance. Patient is  guarded and suspicious and will not give good history. I filled out paperwork to uphold the commitment.   Medications  acetaminophen (TYLENOL) tablet 650 mg (not administered)  ibuprofen (ADVIL,MOTRIN) tablet 600 mg (not administered)  zolpidem (AMBIEN) tablet 5 mg (not administered)  nicotine (NICODERM CQ - dosed in mg/24 hours) patch 21 mg (21 mg Transdermal Patch Applied 08/17/15 2340)  ondansetron (ZOFRAN) tablet 4 mg (not administered)  alum & mag hydroxide-simeth (MAALOX/MYLANTA) 200-200-20 MG/5ML suspension 30 mL (not administered)  clonazePAM (KLONOPIN) tablet 1 mg (1 mg Oral Given 08/17/15 2339)  OLANZapine (ZYPREXA) tablet 7.5 mg (7.5 mg Oral Not Given 08/17/15 2340)    Patient Vitals for the past 24 hrs:  BP Temp Temp src Pulse Resp SpO2 Height Weight  08/17/15 2137 (!) 151/102 mmHg 98.1 F (36.7 C) Oral 83 20 98 % 6' (1.829 m) 245 lb (111.131 kg)    11:03 PM Reevaluation with update and discussion. After initial assessment and treatment, an updated evaluation reveals no change in clinical status. Medically cleared for treatment by psychiatry. Neiva Maenza L    23:05- TTS consultation   ___________________________________________________________________________ Note Below from [facility], 08/[facility]  Treatment Plan   1. Continued admission to the inpatient psychiatric unit. He has signed his 72h form on 08/01/2015 in the morning. We will pursue involuntary commitment should it prove necessary. 2. Risks, benefits, and side effects of current medications reviewed. 3. Zyprexa Zydis discontinued in favor of tablets as dissolvable form does not appear necessary with the patients desire to continue with his medications. Zyprexa also increased to 7.5mg  BID for titration to patient's symptoms. No other changes at this time. Scheduled Meds: . clonazePAM 1 mg Oral BID  . nicotine 1 patch Transdermal Daily@0900   . OLANZapine 7.5 mg Oral BID   4. CIWA  protocol maintained at this time due to a score of 7 on 08/01/2015. Will continue to monitor.  5. Precautions: Suicide, elopement, assault. Starting elopement precautions today. 6. Encouraged active participation in available therapies.  Case discussed and seen with Dr. Ilean SkillKimball.  Leonor LivJonathan Andrew Allan, MD 08/02/2015  House Officer [facility] Department of Psychiatry & Behavioral Health  _________________________________________________________________________           Labs Review Labs Reviewed  COMPREHENSIVE METABOLIC PANEL - Abnormal; Notable for the following:    Potassium 3.4 (*)    Creatinine, Ser 1.38 (*)    All other components within normal limits  ACETAMINOPHEN LEVEL - Abnormal; Notable for the following:    Acetaminophen (Tylenol), Serum <10 (*)    All other components within normal limits  CBC - Abnormal; Notable for the following:    WBC 12.1 (*)    MCHC 36.6 (*)    All other components within normal limits  URINE RAPID DRUG SCREEN, HOSP PERFORMED - Abnormal; Notable for the following:    Cocaine POSITIVE (*)  Benzodiazepines POSITIVE (*)    Tetrahydrocannabinol POSITIVE (*)    All other components within normal limits  ETHANOL  SALICYLATE LEVEL    Imaging Review  No results found. I have personally reviewed and evaluated these images and lab results as part of my medical decision-making.   EKG Interpretation None      MDM   Final diagnoses:  Suicidal ideation  Homicidal ideation  Polysubstance abuse    Persistent and/or recurrent symptoms of suicidality and homicidality. Symptoms now complicated by cocaine abuse.  Nursing Notes Reviewed/ Care Coordinated, and agree without changes. Applicable Imaging Reviewed.  Interpretation of Laboratory Data incorporated into ED treatment  Plan: As per TTS in conjunction with oncoming provider team    Mancel Bale, MD 08/18/15 [id number]

## 2015-08-18 ENCOUNTER — Encounter (HOSPITAL_COMMUNITY): Payer: Self-pay | Admitting: Nurse Practitioner

## 2015-08-18 DIAGNOSIS — F22 Delusional disorders: Secondary | ICD-10-CM

## 2015-08-18 DIAGNOSIS — R45851 Suicidal ideations: Secondary | ICD-10-CM | POA: Insufficient documentation

## 2015-08-18 DIAGNOSIS — F1910 Other psychoactive substance abuse, uncomplicated: Secondary | ICD-10-CM | POA: Insufficient documentation

## 2015-08-18 DIAGNOSIS — R45850 Homicidal ideations: Secondary | ICD-10-CM | POA: Insufficient documentation

## 2015-08-18 LAB — RAPID URINE DRUG SCREEN, HOSP PERFORMED
Amphetamines: NOT DETECTED
Barbiturates: NOT DETECTED
Benzodiazepines: POSITIVE — AB
COCAINE: POSITIVE — AB
Opiates: NOT DETECTED
Tetrahydrocannabinol: POSITIVE — AB

## 2015-08-18 MED ORDER — NICOTINE 21 MG/24HR TD PT24
21.0000 mg | MEDICATED_PATCH | Freq: Once | TRANSDERMAL | Status: AC
  Administered 2015-08-18: 21 mg via TRANSDERMAL
  Filled 2015-08-18: qty 1

## 2015-08-18 NOTE — BH Assessment (Addendum)
[facility] Assessment Progress Note  Per Gerald Taylor, MD, this pt requires psychiatric hospitalization at this time.  The following facilities have been contacted to seek placement for this pt, with results as noted:   Beds available, information sent, decision pending:  [facility]   At capacity:  [facility]   Ryan Hoffmann, MA Triage Specialist [phone]     

## 2015-08-18 NOTE — Consult Note (Signed)
[facility] Consult   Reason for Consult:   Paranoia, agitation Referring Physician:   EDP Patient Identification: BRIGG CAPE MRN:  [id number] Principal Diagnosis: Paranoia [facility]) Diagnosis:   Patient Active Problem List   Diagnosis Date Noted  . Paranoia ([place]) [F22] 01/14/2013    Priority: High  . Substance induced mood disorder ([place]) [F19.94] 01/14/2013  . RECTAL FISSURE [K60.2] 04/19/2010  . PANIC DISORDER,NO AGORAPHOBIA [F41.0] 03/08/2010  . ARTHROPATHY NOS, UNSPECIFIED SITE [M12.9] 03/08/2010  . BACK PAIN [M54.9] 02/14/2010    Total Time spent with patient: 45 minutes  Subjective:   [name] is a 35 y.o. male patient admitted with Paranoia, agitation  HPI: Male, 35 years old was evaluated after he was IVC by his wife and brought in by [facility] for agitation.   Patient states that his wife called him to come to house and he went there and was brought in by the Police.  Patient states that he had informed his wife that he was filing for divorce and that she did not take it kindly.  Patient was recently discharged from [facility] but is not taking his medications for Paranoia.  He has not gone back to work stating that he is afraid somebody will kill him.  He too his Divorce paper to his wife's job and handed the paper to his wife's boss. Patient carries gun every where according to his wife and patient concur to this assertion.  Patient states he has more than one gun but would not say how many.  Patient lacks insight in his illness.  He states he only takes Klonopin and does not need Zyprexa.  He has a hx of substance use disorder and his UDS is positive for Cocaine, Benzodiazepine and Marijuana.  He has been accepted for admission and we will be seeking placement at any facility with available bed.  Past Psychiatric History:  Panic Disorder, Substance induced mood disorder, Paranoia.  Risk to Self: Suicidal Ideation:  (Refused to answer the  questions.) Suicidal Intent:  (Refused to answer the questions.) Is patient at risk for suicide?:  (Refused to answer the questions.) Suicidal Plan?:  (Refused to answer the questions.) Access to Means: Yes Specify Access to Suicidal Means: Law enforcement took one of his guns (Per wife he ahs another one hid at his home) What has been your use of drugs/alcohol within the last 12 months?: Denies  How many times?: 1 Other Self Harm Risks: None Reported Triggers for Past Attempts: None known Intentional Self Injurious Behavior: None Risk to Others:   Prior Inpatient Therapy: Prior Inpatient Therapy:  (Refused to answer the questions.) Prior Outpatient Therapy: Prior Outpatient Therapy:  (Refused to answer the questions.)  Past Medical History:  Past Medical History  Diagnosis Date  . Staph infection 2006    left leg  . MRSA (methicillin resistant staph aureus) culture positive   . Anxiety   . Heart murmur     Past Surgical History  Procedure Laterality Date  . Wrist tenodesis      Right arm  . Leg surgery      MRSA removed   Family History:  Family History  Problem Relation Age of Onset  . Diabetes Father   . Glaucoma Father    Family Psychiatric  History:  Denies Social History:  History  Alcohol Use  . 0.0 oz/week  . 0 Standard drinks or equivalent per week    Comment: occassional  History  Drug Use  . Yes    Comment: occassional joint    Social History   Social History  . Marital Status: Married    Spouse Name: N/A  . Number of Children: N/A  . Years of Education: N/A   Social History Main Topics  . Smoking status: Former Smoker -- 0.50 packs/day  . Smokeless tobacco: Never Used  . Alcohol Use: 0.0 oz/week    0 Standard drinks or equivalent per week     Comment: occassional  . Drug Use: Yes     Comment: occassional joint  . Sexual Activity: Yes   Other Topics Concern  . None   Social History Narrative   Additional Social History:     Allergies:  No Known Allergies  Labs:  Results for orders placed or performed during the hospital encounter of 08/17/15 (from the past 48 hour(s))  Comprehensive metabolic panel     Status: Abnormal   Collection Time: 08/17/15  9:55 PM  Result Value Ref Range   Sodium 136 135 - 145 mmol/L   Potassium 3.4 (L) 3.5 - 5.1 mmol/L   Chloride 102 101 - 111 mmol/L   CO2 25 22 - 32 mmol/L   Glucose, Bld 82 65 - 99 mg/dL   BUN 14 6 - 20 mg/dL   Creatinine, Ser 1.38 (H) 0.61 - 1.24 mg/dL   Calcium 9.1 8.9 - 10.3 mg/dL   Total Protein 7.5 6.5 - 8.1 g/dL   Albumin 4.6 3.5 - 5.0 g/dL   AST 29 15 - 41 U/L   ALT 26 17 - 63 U/L   Alkaline Phosphatase 52 38 - 126 U/L   Total Bilirubin 0.9 0.3 - 1.2 mg/dL   GFR calc non Af Amer >60 >60 mL/min   GFR calc Af Amer >60 >60 mL/min    Comment: (NOTE) The eGFR has been calculated using the CKD EPI equation. This calculation has not been validated in all clinical situations. eGFR's persistently <60 mL/min signify possible Chronic Kidney Disease.    Anion gap 9 5 - 15  cbc     Status: Abnormal   Collection Time: 08/17/15  9:55 PM  Result Value Ref Range   WBC 12.1 (H) 4.0 - 10.5 K/uL   RBC 4.73 4.22 - 5.81 MIL/uL   Hemoglobin 15.4 13.0 - 17.0 g/dL   HCT 42.1 39.0 - 52.0 %   MCV 89.0 78.0 - 100.0 fL   MCH 32.6 26.0 - 34.0 pg   MCHC 36.6 (H) 30.0 - 36.0 g/dL   RDW 12.6 11.5 - 15.5 %   Platelets 180 150 - 400 K/uL  Ethanol     Status: None   Collection Time: 08/17/15  9:56 PM  Result Value Ref Range   Alcohol, Ethyl (B) <5 <5 mg/dL    Comment:        LOWEST DETECTABLE LIMIT FOR SERUM ALCOHOL IS 5 mg/dL FOR MEDICAL PURPOSES ONLY   Salicylate level     Status: None   Collection Time: 08/17/15  9:56 PM  Result Value Ref Range   Salicylate Lvl <3.7 2.8 - 30.0 mg/dL  Acetaminophen level     Status: Abnormal   Collection Time: 08/17/15  9:56 PM  Result Value Ref Range   Acetaminophen (Tylenol), Serum <10 (L) 10 - 30 ug/mL    Comment:         THERAPEUTIC CONCENTRATIONS VARY SIGNIFICANTLY. A RANGE OF 10-30 ug/mL MAY BE AN EFFECTIVE CONCENTRATION FOR MANY PATIENTS. HOWEVER, SOME  ARE BEST TREATED AT CONCENTRATIONS OUTSIDE THIS RANGE. ACETAMINOPHEN CONCENTRATIONS >150 ug/mL AT 4 HOURS AFTER INGESTION AND >50 ug/mL AT 12 HOURS AFTER INGESTION ARE OFTEN ASSOCIATED WITH TOXIC REACTIONS.   Rapid urine drug screen (hospital performed)     Status: Abnormal   Collection Time: 08/17/15 11:41 PM  Result Value Ref Range   Opiates NONE DETECTED NONE DETECTED   Cocaine POSITIVE (A) NONE DETECTED   Benzodiazepines POSITIVE (A) NONE DETECTED   Amphetamines NONE DETECTED NONE DETECTED   Tetrahydrocannabinol POSITIVE (A) NONE DETECTED   Barbiturates NONE DETECTED NONE DETECTED    Comment:        DRUG SCREEN FOR MEDICAL PURPOSES ONLY.  IF CONFIRMATION IS NEEDED FOR ANY PURPOSE, NOTIFY LAB WITHIN 5 DAYS.        LOWEST DETECTABLE LIMITS FOR URINE DRUG SCREEN Drug Class       Cutoff (ng/mL) Amphetamine      1000 Barbiturate      200 Benzodiazepine   676 Tricyclics       195 Opiates          300 Cocaine          300 THC              50     Current Facility-Administered Medications  Medication Dose Route Frequency Provider Last Rate Last Dose  . acetaminophen (TYLENOL) tablet 650 mg  650 mg Oral Q4H PRN Daleen Bo, MD      . alum & mag hydroxide-simeth (MAALOX/MYLANTA) 200-200-20 MG/5ML suspension 30 mL  30 mL Oral PRN Daleen Bo, MD      . clonazePAM Bobbye Charleston) tablet 1 mg  1 mg Oral BID Daleen Bo, MD   1 mg at 08/18/15 0932  . ibuprofen (ADVIL,MOTRIN) tablet 600 mg  600 mg Oral Q8H PRN Daleen Bo, MD      . nicotine (NICODERM CQ - dosed in mg/24 hours) patch 21 mg  21 mg Transdermal Daily PRN Daleen Bo, MD   21 mg at 08/17/15 2340  . OLANZapine (ZYPREXA) tablet 7.5 mg  7.5 mg Oral BID Daleen Bo, MD   7.5 mg at 08/17/15 2340  . ondansetron (ZOFRAN) tablet 4 mg  4 mg Oral Q8H PRN Daleen Bo, MD      .  zolpidem [facility]) tablet 5 mg  5 mg Oral QHS PRN Daleen Bo, MD       Current Outpatient Prescriptions  Medication Sig Dispense Refill  . clonazePAM (KLONOPIN) 1 MG tablet Take 1 mg by mouth 2 (two) times daily.    Marland Kitchen OLANZapine (ZYPREXA) 7.5 MG tablet Take 7.5 mg by mouth 2 (two) times daily.    Marland Kitchen ALPRAZolam (XANAX) 0.5 MG tablet Take 1 or 2 daily at bedtime as needed for sleep (Patient not taking: Reported on 08/17/2015) 60 tablet 1  . varenicline (CHANTIX CONTINUING MONTH PAK) 1 MG tablet Take 1 tablet (1 mg total) by mouth 2 (two) times daily. (Patient not taking: Reported on 08/17/2015) 60 tablet 2  . varenicline (CHANTIX STARTING MONTH PAK) 0.5 MG X 11 & 1 MG X 42 tablet Take one 0.5 mg tablet by mouth once daily for 3 days, then increase to one 0.5 mg tablet twice daily for 4 days, then increase to one 1 mg tablet twice daily. (Patient not taking: Reported on 08/17/2015) 53 tablet 0    Musculoskeletal: Strength & Muscle Tone: within normal limits Gait & Station: normal Patient leans: N/A  Psychiatric Specialty Exam: Physical Exam  Review  of Systems  Constitutional: Negative.   HENT: Negative.   Eyes: Negative.   Respiratory: Negative.   Cardiovascular: Negative.   Gastrointestinal: Negative.   Genitourinary: Negative.   Musculoskeletal: Negative.   Skin: Negative.   Neurological: Negative.   Endo/Heme/Allergies: Negative.     Blood pressure 172/92, pulse 75, temperature 98.1 F (36.7 C), temperature source Oral, resp. rate 21, height 6' (1.829 m), weight 111.131 kg (245 lb), SpO2 100 %.Body mass index is 33.22 kg/(m^2).  General Appearance: Casual and Fairly Groomed  Eye Contact:  Good  Speech:  Clear and Coherent and Normal Rate  Volume:  Normal  Mood:  Angry and Anxious  Affect:  Congruent  Thought Process:  Coherent  Orientation:  Full (Time, Place, and Person)  Thought Content:  Paranoid Ideation  Suicidal Thoughts:  No  Homicidal Thoughts:  No  Memory:   Immediate;   Good Recent;   Good Remote;   Good  Judgement:  Poor  Insight:  Shallow  Psychomotor Activity:  Normal  Concentration:  Concentration: Good  Recall:  Poor  Fund of Knowledge:  Fair  Language:  Good  Akathisia:  No  Handed:  Right  AIMS (if indicated):     Assets:  Desire for Improvement  ADL's:  Intact  Cognition:  WNL  Sleep:        Treatment Plan Summary: Daily contact with patient to assess and evaluate symptoms and progress in treatment and Medication management  Disposition:  Accepted for admission and we will be seeking placement at any facility with available bed.  We have resumed his home medications.  Delfin Gant, NP   PMHNP-BC 08/18/2015 1:13 PM

## 2015-08-18 NOTE — ED Notes (Signed)
Pt talking on hallway phone.  

## 2015-08-18 NOTE — ED Notes (Addendum)
This nurse spoke with Admission department at [facility], informed that because pt bp was outside of normal range that pt did not meet inpatient criteria and that 3 consecutive normal range bps were needed . This nurse spoke with EDP Dr. Effie ShyWentz , no new orders at this time.

## 2015-08-18 NOTE — Progress Notes (Signed)
CSW faxed the pt to the following facilities in order to try and obtain placement:  [facility] [address]  [place]  [place], [place] [phone] ED CSW 08/18/2015 10:04 PM
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
## 2015-08-18 NOTE — Progress Notes (Signed)
[name] of [name] called and states that the are reviewing pt. She states that she need to speak with nurse to ask medical questions. CSW made nurse aware and gave her number to call. [phone].  [name], LCSWA [phone] ED CSW 08/18/2015 6:27 PM
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
## 2015-08-18 NOTE — ED Notes (Signed)
Patient denies SI, HI and AVH this time. Plan of care discussed with patient. Patient voices no complaints or concerns at this time. Encouragement and support provided and safety maintain. Q 15 min safety checks remain in place.

## 2015-08-18 NOTE — ED Provider Notes (Signed)
19:00- nursing is calling inquiring about treating the patient's blood pressure. Last blood pressure at 18:41 is a very minimally elevated, at 144/91. On arrival, last night, when the patient was agitated and intoxicated with cocaine, the blood pressure was 151/102. His screening laboratory results did not show any indicator, for hypertensive urgency. The patient's mental status, is altered related to his psychiatric illness. The patient is stable from a medical perspective. There is no indication to treat his blood pressure with medications at this time. I would recommend that the patient follow-up with a primary care doctor, expectantly, for a blood pressure check in 2-3 weeks' time. Certainly, the blood pressure can be followed while he is hospitalized.  Mancel BaleElliott Marylen Zuk, MD 08/18/15 Ebony Cargo1905
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
## 2015-08-19 NOTE — ED Provider Notes (Signed)
Patient is alert and nontoxic. Vital signs are stable. He has no acute complaint for chest pain shortness of breath or medical illness.  Mental status is clear. Heart regular no rub or gallop. Lungs clear without wheeze or rail. All movements or cornea purposeful symmetric. Skin is warm and dry.  [facility] Patient is under IVC. Patient verbally describes dissatisfaction at the being transferred that far away. He has however been cooperative. EMTALA completed.  Arby BarretteMarcy Anzley Dibbern, MD 08/19/15 1539

## 2015-08-19 NOTE — Consult Note (Signed)
Psychiatric Specialty Exam: Physical Exam  ROS  Blood pressure 125/82, pulse 75, temperature 97.7 F (36.5 C), temperature source Oral, resp. rate 18, height 6' (1.829 m), weight 111.131 kg (245 lb), SpO2 96 %.Body mass index is 33.22 kg/(m^2).  General Appearance: Casual and Fairly Groomed  Eye Contact: Good  Speech: Clear and Coherent and Normal Rate  Volume: Normal  Mood: Angry and Anxious  Affect: Congruent  Thought Process: Coherent  Orientation: Full (Time, Place, and Person)  Thought Content: Paranoid Ideation  Suicidal Thoughts: No  Homicidal Thoughts: No  Memory: Immediate; Good Recent; Good Remote; Good  Judgement: Poor  Insight: Shallow  Psychomotor Activity: Normal  Concentration: Concentration: Good  Recall: Poor  Fund of Knowledge: Fair  Language: Good  Akathisia: No  Handed: Right  AIMS (if indicated):    Assets: Desire for Improvement  ADL's: Intact  Cognition: WNL         Patient remains angry because he is held under IVC and lacks insight to his illness.   He maintains that he has the right to carry a gun and that the community out there is dangerous.  He also repeated the accusation that his wife committed him to the Psych unit because she has the power as a Therapist, occupationalmagistrate to do so.  His affect is flat and his speech is pressured. Paranoia (HCC)   Plan: Seek placement and offer medications as prescribed  Dahlia ByesJosephine Oliver   PMHNP-BC Patient seen face-to-face for psychiatric evaluation, chart reviewed and case discussed with the physician extender and developed treatment plan. Reviewed the information documented and agree with the treatment plan. Thedore MinsMojeed Daliana Leverett, MD

## 2015-08-19 NOTE — BH Assessment (Signed)
[facility] Assessment Progress Note  Per Thedore MinsMojeed Akintayo, MD, this pt continues to require psychiatric hospitalization.  At 15:28 [place] calls [facility]  Pt has been [facility] to their facility [facility].  EDP Dr Donnald GarrePfeiffer concurs with this decision.  Pt is under IVC and [facility]  Pt's nurse, Rudean HittDawnaly, has been notified; she agrees to call report to [phone].  Doylene Canninghomas Claryce Friel, MA Triage Specialist [phone]

## 2015-08-19 NOTE — ED Notes (Signed)
Patient requested to speak to a doctor who could get him placed in a facility closer to home.  I explained that the psychiatrist had gone home for the day and the staff on duty had no choice but to send him to the accepting facility.  He then stated, "I will be back.  I will come in here in street clothes.  Do you have any idea who I am on the street?"  He had a very threatening tone in his voice.
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
## 2015-09-01 ENCOUNTER — Telehealth: Payer: Self-pay | Admitting: Family Medicine

## 2015-09-01 ENCOUNTER — Ambulatory Visit: Payer: Federal, State, Local not specified - PPO | Admitting: Family Medicine

## 2015-09-01 DIAGNOSIS — Z0289 Encounter for other administrative examinations: Secondary | ICD-10-CM

## 2015-09-02 NOTE — Telephone Encounter (Signed)
Pt was no show 09/01/15, 2nd no show with our office since 04/2015, pt has not rescheduled, charge or no charge?

## 2015-09-02 NOTE — Telephone Encounter (Signed)
Yes please charge him

## 2015-09-03 ENCOUNTER — Encounter: Payer: Self-pay | Admitting: Family Medicine

## 2015-09-03 NOTE — Telephone Encounter (Signed)
Marked to charge and mailing no show letter °

## 2015-10-01 ENCOUNTER — Encounter (HOSPITAL_COMMUNITY): Payer: Self-pay | Admitting: Emergency Medicine

## 2015-10-01 ENCOUNTER — Emergency Department (HOSPITAL_COMMUNITY)
Admission: EM | Admit: 2015-10-01 | Discharge: 2015-10-02 | Disposition: A | Discharge status: Home / Self Care | Payer: Federal, State, Local not specified - PPO | Attending: Emergency Medicine | Admitting: Emergency Medicine

## 2015-10-01 DIAGNOSIS — R45851 Suicidal ideations: Secondary | ICD-10-CM | POA: Diagnosis present

## 2015-10-01 DIAGNOSIS — F1414 Cocaine abuse with cocaine-induced mood disorder: Secondary | ICD-10-CM | POA: Diagnosis present

## 2015-10-01 DIAGNOSIS — F1910 Other psychoactive substance abuse, uncomplicated: Secondary | ICD-10-CM | POA: Insufficient documentation

## 2015-10-01 DIAGNOSIS — Z87891 Personal history of nicotine dependence: Secondary | ICD-10-CM | POA: Insufficient documentation

## 2015-10-01 DIAGNOSIS — F1020 Alcohol dependence, uncomplicated: Secondary | ICD-10-CM | POA: Diagnosis not present

## 2015-10-01 DIAGNOSIS — Z79899 Other long term (current) drug therapy: Secondary | ICD-10-CM | POA: Diagnosis not present

## 2015-10-01 LAB — CBC
HEMATOCRIT: 40 % (ref 39.0–52.0)
HEMOGLOBIN: 14.1 g/dL (ref 13.0–17.0)
MCH: 31.3 pg (ref 26.0–34.0)
MCHC: 35.3 g/dL (ref 30.0–36.0)
MCV: 88.9 fL (ref 78.0–100.0)
Platelets: 159 10*3/uL (ref 150–400)
RBC: 4.5 MIL/uL (ref 4.22–5.81)
RDW: 12.5 % (ref 11.5–15.5)
WBC: 8.7 10*3/uL (ref 4.0–10.5)

## 2015-10-01 LAB — COMPREHENSIVE METABOLIC PANEL
ALBUMIN: 4 g/dL (ref 3.5–5.0)
ALK PHOS: 62 U/L (ref 38–126)
ALT: 40 U/L (ref 17–63)
ANION GAP: 7 (ref 5–15)
AST: 53 U/L — ABNORMAL HIGH (ref 15–41)
BUN: 16 mg/dL (ref 6–20)
CHLORIDE: 105 mmol/L (ref 101–111)
CO2: 25 mmol/L (ref 22–32)
Calcium: 8.5 mg/dL — ABNORMAL LOW (ref 8.9–10.3)
Creatinine, Ser: 1.18 mg/dL (ref 0.61–1.24)
GFR calc non Af Amer: >60 mL/min (ref >60)
GLUCOSE: 113 mg/dL — AB (ref 65–99)
POTASSIUM: 3.6 mmol/L (ref 3.5–5.1)
SODIUM: 137 mmol/L (ref 135–145)
Total Bilirubin: 0.9 mg/dL (ref 0.3–1.2)
Total Protein: 6.4 g/dL — ABNORMAL LOW (ref 6.5–8.1)

## 2015-10-01 LAB — ETHANOL: Alcohol, Ethyl (B): <5 mg/dL (ref <5)

## 2015-10-01 LAB — ACETAMINOPHEN LEVEL

## 2015-10-01 LAB — SALICYLATE LEVEL

## 2015-10-01 MED ORDER — ALUM & MAG HYDROXIDE-SIMETH 200-200-20 MG/5ML PO SUSP: 30.0000 mL | ORAL | Status: DC | PRN

## 2015-10-01 MED ORDER — NICOTINE 21 MG/24HR TD PT24
21.0000 mg | MEDICATED_PATCH | Freq: Every day | TRANSDERMAL | Status: DC | PRN
  Administered 2015-10-01: 21 mg via TRANSDERMAL
  Filled 2015-10-01: qty 1

## 2015-10-01 MED ORDER — IBUPROFEN 200 MG PO TABS: 600.0000 mg | ORAL_TABLET | Freq: Three times a day (TID) | ORAL | Status: DC | PRN

## 2015-10-01 MED ORDER — ACETAMINOPHEN 325 MG PO TABS: 650.0000 mg | ORAL_TABLET | ORAL | Status: DC | PRN

## 2015-10-01 MED ORDER — ONDANSETRON HCL 4 MG PO TABS: 4.0000 mg | ORAL_TABLET | Freq: Three times a day (TID) | ORAL | Status: DC | PRN

## 2015-10-01 MED ORDER — ZOLPIDEM TARTRATE 5 MG PO TABS: 5.0000 mg | ORAL_TABLET | Freq: Every evening | ORAL | Status: DC | PRN

## 2015-10-01 MED ORDER — CLONAZEPAM 1 MG PO TABS
1.0000 mg | ORAL_TABLET | Freq: Two times a day (BID) | ORAL | Status: DC
  Administered 2015-10-01 – 2015-10-02 (×2): 1 mg via ORAL
  Filled 2015-10-01 (×2): qty 1

## 2015-10-01 MED ORDER — HYDROXYZINE HCL 25 MG PO TABS
50.0000 mg | ORAL_TABLET | Freq: Every day | ORAL | Status: DC
  Administered 2015-10-01: 50 mg via ORAL
  Filled 2015-10-01: qty 2

## 2015-10-01 NOTE — ED Notes (Signed)
Patient noted in room. No complaints, stable, in no acute distress. Q15 minute rounds and monitoring via security cameras continue for safety. 

## 2015-10-01 NOTE — ED Notes (Signed)
[facility] Expected date:  Expected time:  Means of arrival:  Comments: Judyann MunsonSimmons, Notnamed

## 2015-10-01 NOTE — ED Notes (Signed)
Key for valuables/paperwork given to [place], [place].
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
## 2015-10-01 NOTE — ED Notes (Signed)
Pt's valuables placed in security envelope and given to security. Key put is taped to paperwork and placed in orange folder with IVC paperwork.

## 2015-10-01 NOTE — ED Notes (Signed)
Pt BIB [facility] after being IVC'd by his wife. Paperwork states that pt is dx with Bipolar disorder and has a hx of substance abuse. Pt will not take meds and is drinking alcohol and threatening suicide. Wife was on the phone with patient and heard a loud 'pop' when the phone went dead. Pt is alert and oriented at present.

## 2015-10-01 NOTE — ED Provider Notes (Signed)
CSN: [id number]     Arrival date & time 10/01/15  2113 History   First MD Initiated Contact with Patient 10/01/15 2146     Chief Complaint  Patient presents with  . IVC      (Consider location/radiation/quality/duration/timing/severity/associated sxs/prior Treatment) HPI  [name] is a 35 y.o. male her presents for evaluation of threatening suicide. Patient is not forthcoming. He was committed by his wife, who states that he has been drinking alcohol, threatening to kill himself, and while on the phone with her, she heard a loud bang and then his phone line went dead. She decided to commit him. He was brought here by [employer] examinerlaw enforcement. Patient states that his wife is "cheating on me", and therefore he is planning on leaving the state. He reports he is taking his medication, and drinking alcohol frequently. He denies recent illnesses. He was recently hospitalized in a psychiatric facility for 7 days. He states that he sees a psychiatrist every week. There are no other no modifying factors.    Past Medical History  Diagnosis Date  . Staph infection 2006    left leg  . MRSA (methicillin resistant staph aureus) culture positive   . Anxiety   . Heart murmur   . Polysubstance abuse    Past Surgical History  Procedure Laterality Date  . Wrist tenodesis      Right arm  . Leg surgery      MRSA removed   Family History  Problem Relation Age of Onset  . Diabetes Father   . Glaucoma Father    Social History  Substance Use Topics  . Smoking status: Former Smoker -- 0.50 packs/day  . Smokeless tobacco: Never Used  . Alcohol Use: 0.0 oz/week    0 Standard drinks or equivalent per week     Comment: occassional    Review of Systems  All other systems reviewed and are negative.     Allergies  Review of patient's allergies indicates no known allergies.  Home Medications   Prior to Admission medications   Medication Sig Start Date End Date Taking? Authorizing Provider   clonazePAM (KLONOPIN) 1 MG tablet Take 1 mg by mouth 2 (two) times daily. 08/03/15  Yes Historical Provider, MD  hydrOXYzine (VISTARIL) 50 MG capsule Take 50-100 mg by mouth at bedtime. 08/27/15  Yes Historical Provider, MD  ALPRAZolam Prudy Feeler(XANAX) 0.5 MG tablet Take 1 or 2 daily at bedtime as needed for sleep Patient not taking: Reported on 08/17/2015 06/16/15   Pearline CablesJessica C Copland, MD  varenicline (CHANTIX CONTINUING MONTH PAK) 1 MG tablet Take 1 tablet (1 mg total) by mouth 2 (two) times daily. Patient not taking: Reported on 08/17/2015 05/31/15   Pearline CablesJessica C Copland, MD  varenicline (CHANTIX STARTING MONTH PAK) 0.5 MG X 11 & 1 MG X 42 tablet Take one 0.5 mg tablet by mouth once daily for 3 days, then increase to one 0.5 mg tablet twice daily for 4 days, then increase to one 1 mg tablet twice daily. Patient not taking: Reported on 08/17/2015 05/31/15   Gwenlyn FoundJessica C Copland, MD   BP 125/87 mmHg  Pulse 84  Temp(Src) 97.7 F (36.5 C) (Oral)  Resp 20  Ht 6' (1.829 m)  Wt 250 lb (113.399 kg)  BMI 33.90 kg/m2  SpO2 98% Physical Exam  Constitutional: He is oriented to person, place, and time. He appears well-developed and well-nourished.  HENT:  Head: Normocephalic and atraumatic.  Right Ear: External ear normal.  Left Ear: External  ear normal.  Eyes: Conjunctivae and EOM are normal. Pupils are equal, round, and reactive to light.  Neck: Normal range of motion and phonation normal. Neck supple.  Cardiovascular: Normal rate, regular rhythm and normal heart sounds.   Pulmonary/Chest: Effort normal and breath sounds normal. He exhibits no bony tenderness.  Abdominal: Soft. There is no tenderness.  Musculoskeletal: Normal range of motion.  Neurological: He is alert and oriented to person, place, and time. No cranial nerve deficit or sensory deficit. He exhibits normal muscle tone. Coordination normal.  Skin: Skin is warm, dry and intact.  Psychiatric: His behavior is normal. Judgment and thought content normal.   Anxious, flight of ideas, avoids answering questions directly.  Nursing note and vitals reviewed.   ED Course  Procedures (including critical care time)  Medications  acetaminophen (TYLENOL) tablet 650 mg (not administered)  ibuprofen (ADVIL,MOTRIN) tablet 600 mg (not administered)  zolpidem (AMBIEN) tablet 5 mg (not administered)  nicotine (NICODERM CQ - dosed in mg/24 hours) patch 21 mg (21 mg Transdermal Patch Applied 10/01/15 2229)  ondansetron (ZOFRAN) tablet 4 mg (not administered)  alum & mag hydroxide-simeth (MAALOX/MYLANTA) 200-200-20 MG/5ML suspension 30 mL (not administered)  hydrOXYzine (ATARAX/VISTARIL) tablet 50-100 mg (50 mg Oral Given 10/01/15 2258)    Patient Vitals for the past 24 hrs:  BP Temp Temp src Pulse Resp SpO2 Height Weight  10/02/15 0637 125/87 mmHg 97.7 F (36.5 C) Oral 84 20 98 % - -  10/01/15 2135 132/90 mmHg 98.7 F (37.1 C) Oral 86 16 97 % 6' (1.829 m) 250 lb (113.399 kg)    IVC upheld by me  TTS consult      Labs Review Labs Reviewed  COMPREHENSIVE METABOLIC PANEL - Abnormal; Notable for the following:    Glucose, Bld 113 (*)    Calcium 8.5 (*)    Total Protein 6.4 (*)    AST 53 (*)    All other components within normal limits  ACETAMINOPHEN LEVEL - Abnormal; Notable for the following:    Acetaminophen (Tylenol), Serum <10 (*)    All other components within normal limits  URINE RAPID DRUG SCREEN, HOSP PERFORMED - Abnormal; Notable for the following:    Cocaine POSITIVE (*)    Benzodiazepines POSITIVE (*)    All other components within normal limits  ETHANOL  SALICYLATE LEVEL  CBC    Imaging Review No results found. I have personally reviewed and evaluated these images and lab results as part of my medical decision-making.   EKG Interpretation None      MDM   Final diagnoses:  Suicidal ideation  Alcoholism (HCC)  Cocaine abuse with cocaine-induced mood disorder (HCC)    SI and alcoholism.  Nursing Notes Reviewed/  Care Coordinated Applicable Imaging Reviewed Interpretation of Laboratory Data incorporated into ED treatment  Plan- as per TTS    Mancel BaleElliott Briana Newman, MD 10/02/15 1133

## 2015-10-02 DIAGNOSIS — F1414 Cocaine abuse with cocaine-induced mood disorder: Secondary | ICD-10-CM | POA: Diagnosis not present

## 2015-10-02 HISTORY — DX: Cocaine abuse with cocaine-induced mood disorder: F14.14

## 2015-10-02 LAB — RAPID URINE DRUG SCREEN, HOSP PERFORMED
AMPHETAMINES: NOT DETECTED
BARBITURATES: NOT DETECTED
BENZODIAZEPINES: POSITIVE — AB
Cocaine: POSITIVE — AB
Opiates: NOT DETECTED
TETRAHYDROCANNABINOL: NOT DETECTED

## 2015-10-02 NOTE — ED Notes (Signed)
Pt discharged home per MD order. Discharge summary reviewed with pt. Pt verbalizes understanding of discharge summary. Pt denies SI/HI and denies AVH at discharge. Pt signed for personal belongings and property returned. Pt signed e-signature. Ambulatory off unit.

## 2015-10-02 NOTE — ED Notes (Signed)
When staff woke pt to obtain vitals pt reported that he is anxious and wants his vistaril, Clinical research associatewriter reported that its a scheduled medication and it was not due yet. Pt began to explain to writer that it has been at least 6 hours since he received it and he soul be able to get it now. Writer reminded pt that the MD wrote it as a scheduled medication, and that he could address it with the psychiatrist when they arrived for the day.
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
## 2015-10-02 NOTE — BHH Suicide Risk Assessment (Signed)
Suicide Risk Assessment  Discharge Assessment   [facility]   Principal Problem: Cocaine abuse with cocaine-induced mood disorder [facility](HCC) Discharge Diagnoses:  Patient Active Problem List   Diagnosis Date Noted  . Cocaine abuse with cocaine-induced mood disorder (HCC) [F14.14] 10/02/2015    Priority: High  . Homicidal ideation [R45.850]   . Polysubstance abuse [F19.10]   . Suicidal ideation [R45.851]   . Paranoia (HCC) [F22] 01/14/2013  . Substance induced mood disorder (HCC) [F19.94] 01/14/2013  . RECTAL FISSURE [K60.2] 04/19/2010  . PANIC DISORDER,NO AGORAPHOBIA [F41.0] 03/08/2010  . ARTHROPATHY NOS, UNSPECIFIED SITE [M12.9] 03/08/2010  . BACK PAIN [M54.9] 02/14/2010    Total Time spent with patient: 45 minutes   Musculoskeletal: Strength & Muscle Tone: within normal limits Gait & Station: normal Patient leans: N/A  Psychiatric Specialty Exam: Physical Exam  Constitutional: He is oriented to person, place, and time. He appears well-developed and well-nourished.  HENT:  Head: Normocephalic.  Neck: Normal range of motion.  Respiratory: Effort normal.  Musculoskeletal: Normal range of motion.  Neurological: He is alert and oriented to person, place, and time.  Skin: Skin is warm and dry.  Psychiatric: His speech is normal and behavior is normal. Judgment and thought content normal. His mood appears anxious. Cognition and memory are normal.    Review of Systems  Constitutional: Negative.   HENT: Negative.   Eyes: Negative.   Respiratory: Negative.   Cardiovascular: Negative.   Gastrointestinal: Negative.   Genitourinary: Negative.   Musculoskeletal: Negative.   Skin: Negative.   Neurological: Negative.   Endo/Heme/Allergies: Negative.   Psychiatric/Behavioral: Positive for substance abuse. The patient is nervous/anxious.     Blood pressure 125/87, pulse 84, temperature 97.7 F (36.5 C), temperature source Oral, resp. rate 20, height 6'  (1.829 m), weight 113.399 kg (250 lb), SpO2 98 %.Body mass index is 33.9 kg/(m^2).  General Appearance: Casual  Eye Contact:  Good  Speech:  Normal Rate  Volume:  Normal  Mood:  Anxious  Affect:  Congruent  Thought Process:  Coherent and Descriptions of Associations: Intact  Orientation:  Full (Time, Place, and Person)  Thought Content:  WDL  Suicidal Thoughts:  No  Homicidal Thoughts:  No  Memory:  Immediate;   Good Recent;   Good Remote;   Good  Judgement:  Fair  Insight:  Fair  Psychomotor Activity:  Normal  Concentration:  Concentration: Good and Attention Span: Good  Recall:  Good  Fund of Knowledge:  Good  Language:  Good  Akathisia:  No  Handed:  Right  AIMS (if indicated):     Assets:  Leisure Time Physical Health Resilience Social Support  ADL's:  Intact  Cognition:  WNL  Sleep:       Mental Status Per Nursing Assessment::   On Admission:   cocaine abuse with depressed mood  Demographic Factors:  Male  Loss Factors: Loss of significant relationship  Historical Factors: NA  Risk Reduction Factors:   Sense of responsibility to family, Living with another person, especially a relative, Positive social support and Positive coping skills or problem solving skills  Continued Clinical Symptoms:  Anxiety mild  Cognitive Features That Contribute To Risk:  None    Suicide Risk:  Minimal: No identifiable suicidal ideation.  Patients presenting with no risk factors but with morbid ruminations; may be classified as minimal risk based on the severity of the depressive symptoms    Plan Of Care/Follow-up recommendations:  Activity:  as tolerated Diet:  heart healthy diet  LORD, JAMISON, NP 10/02/2015, 11:24 AM
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
## 2015-11-08 ENCOUNTER — Encounter (HOSPITAL_BASED_OUTPATIENT_CLINIC_OR_DEPARTMENT_OTHER): Payer: Self-pay

## 2015-11-08 ENCOUNTER — Ambulatory Visit (INDEPENDENT_AMBULATORY_CARE_PROVIDER_SITE_OTHER): Payer: Federal, State, Local not specified - PPO | Admitting: Family Medicine

## 2015-11-08 ENCOUNTER — Ambulatory Visit (HOSPITAL_BASED_OUTPATIENT_CLINIC_OR_DEPARTMENT_OTHER)
Admission: RE | Admit: 2015-11-08 | Discharge: 2015-11-08 | Disposition: A | Discharge status: Home / Self Care | Payer: Federal, State, Local not specified - PPO | Source: Ambulatory Visit | Attending: Family Medicine | Admitting: Family Medicine

## 2015-11-08 DIAGNOSIS — M25512 Pain in left shoulder: Secondary | ICD-10-CM

## 2015-11-08 DIAGNOSIS — M542 Cervicalgia: Secondary | ICD-10-CM

## 2015-11-08 MED ORDER — METHOCARBAMOL 500 MG PO TABS: 500.0000 mg | ORAL_TABLET | Freq: Four times a day (QID) | ORAL | 0 refills | Status: DC

## 2015-11-08 MED ORDER — CERVICAL COLLAR ADJUSTABLE MISC: 0 refills | Status: DC

## 2015-11-08 NOTE — Progress Notes (Signed)
Pre visit review using our clinic review tool, if applicable. No additional management support is needed unless otherwise documented below in the visit note. 
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
## 2015-11-08 NOTE — Patient Instructions (Addendum)
Your films are negative- good news!  Use the robaxin as needed for muscle soreness.  Remember the robaxin will make you feel sleepy, especially if used with other sedating medications. Heat and a soft neck collar will also help Let me know if you are not improving over the next few days- Sooner if worse.   I would encourage you to continue seeing your current psychiatrist until you find a new provider

## 2015-11-12 ENCOUNTER — Telehealth: Payer: Self-pay | Admitting: Family Medicine

## 2015-11-12 ENCOUNTER — Ambulatory Visit (INDEPENDENT_AMBULATORY_CARE_PROVIDER_SITE_OTHER): Payer: Federal, State, Local not specified - PPO | Admitting: Family Medicine

## 2015-11-12 ENCOUNTER — Encounter: Payer: Self-pay | Admitting: Family Medicine

## 2015-11-12 VITALS — BP 130/98 | HR 107 | Temp 97.9°F | Ht 72.0 in | Wt 246.2 lb

## 2015-11-12 DIAGNOSIS — Z720 Tobacco use: Secondary | ICD-10-CM

## 2015-11-12 DIAGNOSIS — R030 Elevated blood-pressure reading, without diagnosis of hypertension: Secondary | ICD-10-CM

## 2015-11-12 DIAGNOSIS — IMO0001 Reserved for inherently not codable concepts without codable children: Secondary | ICD-10-CM

## 2015-11-12 DIAGNOSIS — H547 Unspecified visual loss: Secondary | ICD-10-CM

## 2015-11-12 NOTE — Progress Notes (Signed)
Pre visit review using our clinic review tool, if applicable. No additional management support is needed unless otherwise documented below in the visit note. 

## 2015-11-12 NOTE — Progress Notes (Signed)
Chief Complaint  Patient presents with  . Referral to get CT scan    pt was seen to us by eye doctor    Subjective: Patient is a 35 y.o. male here for R eye pain and R arm numbness. He is here with his mother.  Went to optometrist to get a check up on Monday(4 days ago). His provider believes that something is blocking the blood flow to the back of his R eye. 1 year ago was told a vessel didn't look right. At his recent visit, it had progressed and was told that he needs imaging of his head to see what is causing the blockage. He does not remember the term his provider used for his dx. He has noticed progressively worsening vision out of the R eye as well. Additionally, he has R forearm numbness that has been persistent, though unchanged, over the past 3 years. Some weakness with the R forearm as well. Denies difficulty with speech, trouble swallowing, or balance issues.  ROS: Eyes: +vision loss in R eye, no pain Neuro: +numbness in R forearm, no tingling, no balance issues, +weakness in R forearm  Family History  Problem Relation Age of Onset  . Diabetes Father   . Glaucoma Father    Past Medical History:  Diagnosis Date  . Anxiety   . Heart murmur   . MRSA (methicillin resistant staph aureus) culture positive   . Polysubstance abuse   . Staph infection 2006   left leg   No Known Allergies  Current Outpatient Prescriptions:  .  Elastic Bandages & Supports (CERVICAL COLLAR ADJUSTABLE) MISC, Use as needed for neck soreness, Disp: 1 each, Rfl: 0 .  hydrOXYzine (VISTARIL) 50 MG capsule, Take 50-100 mg by mouth at bedtime., Disp: , Rfl:  .  methocarbamol (ROBAXIN) 500 MG tablet, Take 1 tablet (500 mg total) by mouth 4 (four) times daily., Disp: 30 tablet, Rfl: 0 .  ALPRAZolam (XANAX) 0.5 MG tablet, Take 1 or 2 daily at bedtime as needed for sleep (Patient not taking: Reported on 08/17/2015), Disp: 60 tablet, Rfl: 1 .  clonazePAM (KLONOPIN) 1 MG tablet, Take 1 mg by mouth 2 (two) times  daily., Disp: , Rfl:  .  varenicline (CHANTIX CONTINUING MONTH PAK) 1 MG tablet, Take 1 tablet (1 mg total) by mouth 2 (two) times daily. (Patient not taking: Reported on 08/17/2015), Disp: 60 tablet, Rfl: 2 .  varenicline (CHANTIX STARTING MONTH PAK) 0.5 MG X 11 & 1 MG X 42 tablet, Take one 0.5 mg tablet by mouth once daily for 3 days, then increase to one 0.5 mg tablet twice daily for 4 days, then increase to one 1 mg tablet twice daily. (Patient not taking: Reported on 08/17/2015), Disp: 53 tablet, Rfl: 0  Objective: BP (!) 130/98 (BP Location: Right Arm, Patient Position: Sitting, Cuff Size: Large)   Pulse (!) 107   Temp 97.9 F (36.6 C) (Oral)   Ht 6' (1.829 m)   Wt 246 lb 3.2 oz (111.7 kg)   SpO2 98%   BMI 33.39 kg/m  General: Awake, appears stated age HEENT: MMM, EOMi, PERRLA Heart: RRR, no murmurs, I appreciate no bruits Lungs: CTAB, no rales, wheezes or rhonchi. Normal effort MSK: No asymmetry or muscle atrophy, 4/5 grip strength on R, 5/5 strength UE's othewise Neuro: 1/4 biceps reflex b/l wo clonus; sensation decreased on R forearm compared to the L; CN intact Psych: Age appropriate judgment and insight, normal affect and mood  Assessment and Plan: Vision  loss - Plan: MR Angiogram Head Wo Contrast  Elevated blood pressure  Tobacco abuse  Orders as above. Discussed with radiologist the best study to order. I do not believe anything acute is taking place that would warrant emergent treatment. I did explain, and write down, warning signs in which he should seek medical care. He needs to stop smoking. He states he is using Chantix to help. Pt states he is very anxious about his prognosis and that his why his blood pressure is elevated. I will give him the benefit of the doubt and have him f/u routinely. Of note, he did ask for something for anxiety. I was going to give him hydroxyzine, but he said up to 100 mg per dose does not work. He also noted that Klonopin doesn't work and  he is in the process of finding a new psychiatrist. I told him that I do not routinely rx these short acting benzo's without the input of a psychiatrist, and he was agreeable to get this medication from his specialist.  The patient voiced understanding and agreement to the plan.  Jilda RocheNicholas Paul [place]

## 2015-11-12 NOTE — Patient Instructions (Addendum)
We will contact you regarding the MRI of your brain.   If you have worsening numbness/tingling, weakness, or balance issues, contact our office or seek emergent care.

## 2015-11-12 NOTE — Telephone Encounter (Signed)
[facility] Day - Client TELEPHONE ADVICE RECORD [facility] Patient Name: Ryan MunsonDWARD Dansby DOB: November 10, 1980 Initial Comment Caller states he went to the eye doctor, they think something is blocking his blood flow. He now is having right eye pain, and right arm numbness. Nurse Assessment Nurse: Debera Latalston, RN, Tinnie GensJeffrey Date/Time [name](Eastern Time): 11/12/2015 9:09:43 AM Confirm and document reason for call. If symptomatic, describe symptoms. You must click the next button to save text entered. ---Caller states he went to the eye doctor, they think something is blocking his blood flow. He is having issues with vision, and right arm numbness. Has the patient traveled out of the country within the last 30 days? ---No Does the patient have any new or worsening symptoms? ---Yes Will a triage be completed? ---Yes Related visit to physician within the last 2 weeks? ---Yes Does the PT have any chronic conditions? (i.e. diabetes, asthma, etc.) ---No Is this a behavioral health or substance abuse call? ---No Guidelines Guideline Title Affirmed Question Affirmed Notes Vision Loss or Change Many floaters in the eye Neurologic Deficit [1] Numbness or tingling in one or both hands AND [2] is a chronic symptom (recurrent or ongoing AND present > 4 weeks) Final Disposition User See PCP When Office is Open (within 3 days) Debera Latalston, RN, [employer] CorporationJeffrey Comments Caller reports that he already has appointment scheduled with office for 1:00 p.m. today. Referrals REFERRED TO PCP OFFICE Disagree/Comply: Comply

## 2015-11-13 ENCOUNTER — Ambulatory Visit (HOSPITAL_BASED_OUTPATIENT_CLINIC_OR_DEPARTMENT_OTHER)
Admission: RE | Admit: 2015-11-13 | Discharge: 2015-11-13 | Disposition: A | Discharge status: Home / Self Care | Payer: Federal, State, Local not specified - PPO | Source: Ambulatory Visit | Attending: Family Medicine | Admitting: Family Medicine

## 2015-11-13 DIAGNOSIS — H547 Unspecified visual loss: Secondary | ICD-10-CM | POA: Diagnosis not present

## 2015-11-15 ENCOUNTER — Telehealth: Payer: Self-pay | Admitting: Family Medicine

## 2015-11-15 NOTE — Telephone Encounter (Signed)
Called MRA of his head from 8/19 is normal- this was ordered due to concern of his optho, Dr. Jacal[facility]Stephen [facility].  Let pt know that the MRA looks ok, and I will contact his eye doctor and give him this report

## 2015-11-15 NOTE — Telephone Encounter (Signed)
Relationship to patient: self Can be reached: [phone]  Reason for call: pt called for mri results done 11/13/15. Advised him Dr. Carmelia RollerWendling out today. Pt states he needs to talk to Dr. Patsy Lageropland anyway since he only saw Dr. Carmelia RollerWendling because she was out. Pt is requesing call with results.

## 2015-11-17 ENCOUNTER — Encounter: Payer: Self-pay | Admitting: Family Medicine

## 2015-11-17 ENCOUNTER — Ambulatory Visit (INDEPENDENT_AMBULATORY_CARE_PROVIDER_SITE_OTHER): Payer: Federal, State, Local not specified - PPO | Admitting: Family Medicine

## 2015-11-17 VITALS — BP 142/90 | HR 100 | Temp 97.6°F | Wt 243.0 lb

## 2015-11-17 DIAGNOSIS — Z711 Person with feared health complaint in whom no diagnosis is made: Secondary | ICD-10-CM | POA: Diagnosis not present

## 2015-11-17 DIAGNOSIS — R29898 Other symptoms and signs involving the musculoskeletal system: Secondary | ICD-10-CM | POA: Diagnosis not present

## 2015-11-17 NOTE — Patient Instructions (Signed)
It was good to see you today - I am going to make appointments for you with an ophthalmologist (eye specialist) and a neurologist to look at your arm weakness.  Let me know if any of your symptoms are worsening in the meantime!   You might try finding a psychiatrist in [place], [place], [place], or [place] One suggestion is [facility], or [facility] Take care!
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
## 2015-11-17 NOTE — Progress Notes (Signed)
Pre visit review using our clinic review tool, if applicable. No additional management support is needed unless otherwise documented below in the visit note. 

## 2015-11-24 NOTE — Telephone Encounter (Signed)
Called and spoke with the pt regarding his MRA results.  Pt stated that he has already been informed of the results.//AB/CMA

## 2015-12-02 ENCOUNTER — Ambulatory Visit (INDEPENDENT_AMBULATORY_CARE_PROVIDER_SITE_OTHER): Payer: Federal, State, Local not specified - PPO | Admitting: Neurology

## 2015-12-02 ENCOUNTER — Encounter: Payer: Self-pay | Admitting: Neurology

## 2015-12-02 VITALS — BP 140/92 | HR 83 | Ht 72.0 in | Wt 247.1 lb

## 2015-12-02 DIAGNOSIS — G5621 Lesion of ulnar nerve, right upper limb: Secondary | ICD-10-CM
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
## 2015-12-02 NOTE — Patient Instructions (Addendum)
We will do nerve testing of the right arm and decide the next step based on these results.

## 2015-12-09 ENCOUNTER — Ambulatory Visit (INDEPENDENT_AMBULATORY_CARE_PROVIDER_SITE_OTHER): Payer: Federal, State, Local not specified - PPO | Admitting: Neurology

## 2015-12-09 ENCOUNTER — Telehealth: Payer: Self-pay | Admitting: Neurology

## 2015-12-09 DIAGNOSIS — G5621 Lesion of ulnar nerve, right upper limb: Secondary | ICD-10-CM

## 2015-12-09 DIAGNOSIS — G5601 Carpal tunnel syndrome, right upper limb: Secondary | ICD-10-CM

## 2015-12-09 NOTE — Telephone Encounter (Signed)
Results of EMG discussed with patient which showed right CTS.  Recommend that he start using a wrist splint, especially when working and avoid hyperflexion of the wrist.  Amisha Pospisil K. Allena KatzPatel, DO

## 2015-12-09 NOTE — Procedures (Signed)
[facility]  [address] [place], [address]  [place], [place] [address] Tel: [phone] Fax:  [phone] Test Date:  12/09/2015  Patient: [name] DOB: 1980-05-19 Physician: Nita Sickle, DO  Sex: Male Height: 6\' 0"  Ref Phys: Nita Sickle, DO  ID#: [id number] Temp: 32.6C Technician: Judie Petit. Dean   Patient Complaints: This is a 35 year old gentleman referred for evaluation of right forearm numbness and pain.  NCV & EMG Findings: Electrodiagnostic testing of the right upper extremity and additional studies of the left shows: 1. Right median, ulnar, radial, and bilateral medial antebrachial cutaneous sensory responses are within normal limits. Mixed palmar sensory responses show prolonged latency. 2. Right median and ulnar motor responses are within normal limits. 3. There is no evidence of active or chronic motor axon loss changes affecting any of the tested muscles. Motor unit configuration and recruitment pattern is within normal limits.  Impression: 1. Right median neuropathy at or distal to the wrist, consistent with the clinical diagnosis of carpal tunnel syndrome. Overall, these findings are mild in degree electrically. 2. There is no evidence of a cervical radiculopathy, ulnar neuropathy, or a brachial plexopathy affecting the right upper extremity.   ___________________________ Nita Sickle, DO    Nerve Conduction Studies Anti Sensory Summary Table   Stim Site NR Peak (ms) Norm Peak (ms) P-T Amp (V) Norm P-T Amp  Left Med Ante Brach Cutan Anti Sensory (Med Forearm)  32.6C  Elbow    3.3  9.0   Right Med Ante Brach Cutan Anti Sensory (Med Forearm)  32.6C  Elbow    3.0  13.4   Right Median Anti Sensory (2nd Digit)  32.6C  Wrist    3.4 <3.4 22.0 >20  Right Radial Anti Sensory (Base 1st Digit)  32.6C  Wrist    2.1 <2.7 34.2 >18  Right Ulnar Anti Sensory (5th Digit)  32.6C  Wrist    2.8 <3.1 35.9 >12   Motor Summary Table   Stim Site NR Onset (ms) Norm  Onset (ms) O-P Amp (mV) Norm O-P Amp Site1 Site2 Delta-0 (ms) Dist (cm) Vel (m/s) Norm Vel (m/s)  Right Median Motor (Abd Poll Brev)  32.6C  Wrist    3.6 <3.9 9.1 >6 Elbow Wrist 5.0 27.0 54 >50  Elbow    8.6  9.0         Right Ulnar Motor (Abd Dig Minimi)  32.6C  Wrist    2.7 <3.1 9.9 >7 B Elbow Wrist 4.2 24.0 57 >50  B Elbow    6.9  9.1  A Elbow B Elbow 1.5 10.0 67 >50  A Elbow    8.4  9.0          Comparison Summary Table   Stim Site NR Peak (ms) Norm Peak (ms) P-T Amp (V) Site1 Site2 Delta-P (ms) Norm Delta (ms)  Right Median/Ulnar Palm Comparison (Wrist - 8cm)  32.6C  Median Palm    2.5 <2.2 18.9 Median Palm Ulnar Palm 1.0   Ulnar Palm    1.5 <2.2 12.7       EMG   Side Muscle Ins Act Fibs Psw Fasc Number Recrt Dur Dur. Amp Amp. Poly Poly. Comment  Right 1stDorInt Nml Nml Nml Nml Nml Nml Nml Nml Nml Nml Nml Nml N/A  Right Abd Poll Brev Nml Nml Nml Nml Nml Nml Nml Nml Nml Nml Nml Nml N/A  Right Ext Indicis Nml Nml Nml Nml Nml Nml Nml Nml Nml Nml Nml Nml N/A  Right PronatorTeres  Nml Nml Nml Nml Nml Nml Nml Nml Nml Nml Nml Nml N/A  Right Biceps Nml Nml Nml Nml Nml Nml Nml Nml Nml Nml Nml Nml N/A  Right Triceps Nml Nml Nml Nml Nml Nml Nml Nml Nml Nml Nml Nml N/A  Right Deltoid Nml Nml Nml Nml Nml Nml Nml Nml Nml Nml Nml Nml N/A      Waveforms:

## 2016-01-13 ENCOUNTER — Telehealth: Payer: Self-pay | Admitting: Family Medicine

## 2016-01-13 NOTE — Telephone Encounter (Signed)
I last saw this pt in August- he has a history of substance abuse and was positive for cocaine twice in the last year.  He brings in a copy of a letter today from his therapist Verita Schneidersnnie Hadgkiss [phone]) stating that he has used xanax as needed and that it helped him.  I am still concerned however at this pts drug abuse history.  Called him to discuss but no answer, no VM

## 2016-01-15 NOTE — Telephone Encounter (Signed)
Called him back- he is seeing psychiatry in about 2 weeks but wonders if he can have xanax for anxiety in the interim.  If his drug screen is clean I am willing to do this.  He plans to come in on Monday to do a UDS

## 2016-01-17 ENCOUNTER — Other Ambulatory Visit: Payer: Federal, State, Local not specified - PPO

## 2016-01-17 ENCOUNTER — Encounter: Payer: Self-pay | Admitting: Family Medicine

## 2016-01-17 NOTE — Telephone Encounter (Signed)
Called David in lab and confirmed- pt can come in for UDS, no pre-order is required

## 2016-01-19 ENCOUNTER — Telehealth: Payer: Self-pay | Admitting: Emergency Medicine

## 2016-01-19 NOTE — Telephone Encounter (Signed)
Please let him know that I am waiting for the results but will let him know as soon as I receive them!

## 2016-01-19 NOTE — Telephone Encounter (Signed)
Patient states that he did the UDS on Monday, and would like to know when he can come in a get his prescription. Please advise.

## 2016-01-19 NOTE — Telephone Encounter (Signed)
Spoke with pt. Advised that he will receive a call once results are in.

## 2016-01-21 ENCOUNTER — Ambulatory Visit: Payer: Federal, State, Local not specified - PPO | Admitting: Neurology

## 2016-01-26 MED ORDER — ALPRAZOLAM 1 MG PO TABS: ORAL_TABLET | ORAL | 0 refills | Status: DC

## 2016-01-26 NOTE — Telephone Encounter (Signed)
Received his UDS- it is negative for all substances.  Will rx xanax for him as requested. He states that he was on 1 mg BID in the past- will start on 0.5 as he has not been taking this in some time

## 2016-04-19 ENCOUNTER — Encounter: Payer: Self-pay | Admitting: Family Medicine

## 2016-04-19 DIAGNOSIS — H349 Unspecified retinal vascular occlusion: Secondary | ICD-10-CM | POA: Insufficient documentation

## 2016-04-27 ENCOUNTER — Ambulatory Visit (INDEPENDENT_AMBULATORY_CARE_PROVIDER_SITE_OTHER): Payer: Federal, State, Local not specified - PPO | Admitting: Family Medicine

## 2016-04-27 ENCOUNTER — Ambulatory Visit (HOSPITAL_BASED_OUTPATIENT_CLINIC_OR_DEPARTMENT_OTHER)
Admission: RE | Admit: 2016-04-27 | Discharge: 2016-04-27 | Disposition: A | Discharge status: Home / Self Care | Payer: Federal, State, Local not specified - PPO | Source: Ambulatory Visit | Attending: Family Medicine | Admitting: Family Medicine

## 2016-04-27 ENCOUNTER — Encounter: Payer: Self-pay | Admitting: Family Medicine

## 2016-04-27 VITALS — BP 148/95 | HR 72 | Temp 97.6°F | Ht 72.0 in | Wt 262.6 lb

## 2016-04-27 DIAGNOSIS — W108XXA Fall (on) (from) other stairs and steps, initial encounter: Secondary | ICD-10-CM | POA: Diagnosis not present

## 2016-04-27 DIAGNOSIS — M542 Cervicalgia: Secondary | ICD-10-CM | POA: Diagnosis not present

## 2016-04-27 DIAGNOSIS — M533 Sacrococcygeal disorders, not elsewhere classified: Secondary | ICD-10-CM | POA: Insufficient documentation

## 2016-04-27 DIAGNOSIS — F411 Generalized anxiety disorder: Secondary | ICD-10-CM | POA: Diagnosis not present

## 2016-04-27 DIAGNOSIS — Z87898 Personal history of other specified conditions: Secondary | ICD-10-CM

## 2016-04-27 DIAGNOSIS — F1411 Cocaine abuse, in remission: Secondary | ICD-10-CM

## 2016-04-27 DIAGNOSIS — R030 Elevated blood-pressure reading, without diagnosis of hypertension: Secondary | ICD-10-CM

## 2016-04-27 DIAGNOSIS — R635 Abnormal weight gain: Secondary | ICD-10-CM

## 2016-04-27 MED ORDER — ALPRAZOLAM 1 MG PO TABS: ORAL_TABLET | ORAL | 0 refills | Status: DC

## 2016-04-27 MED ORDER — MELOXICAM 15 MG PO TABS
15.0000 mg | ORAL_TABLET | Freq: Every day | ORAL | 1 refills | Status: DC
Start: 2016-04-27 — End: 2016-07-28

## 2016-04-27 NOTE — Progress Notes (Signed)
Pre visit review using our clinic review tool, if applicable. No additional management support is needed unless otherwise documented below in the visit note. 

## 2016-04-27 NOTE — Progress Notes (Signed)
[facility] [address] [place], [place] [address] [phone]  Date:  04/27/2016   Name:  [name]   DOB:  1981-02-10   MRN:  [id number]  PCP:  Abbe Amsterdam, MD    Chief Complaint: Back Pain (Pt fell down the stairs this past Sunday and is having lower back, neck and tailbone pain. Due for flu vaccine today. )   History of Present Illness:  [name] is a 36 y.o. very pleasant male patient who presents with the following:  Last seen by myself in August of last year Here today with complaint of having fallen down some stairs on Sunday - today is Thursday. He has tried what sounds like tramadol and also some ibuprofen but that did not seem to help.  He notes that he has a lot of pain in his tailbone area and wonders if this is fractured He was going down some steps, slipped and fell backwards- he hit the back of his head on the steps and also landed on his tailbone.  He is not sure if he had any LOC but does not think so. His head is ok at this time.  Right now the main issue is in his tailbone. He notes that he cannot sleep or sit down without pain He is having normal bowel movements, no blood  Reviewed NCCSR: nothing but xanax that I gave him on 01/26/16 Positive for cocaine twice in the last year - may and July We did a UDS for him in November that was negative- this was required prior to rx for xanax  BP Readings from Last 3 Encounters:  04/27/16 (!) 159/98  12/02/15 (!) 140/92  11/17/15 (!) 142/90    He did quit smoking, and notes that he has gained some weight.  Wonders if I can rx an appetite suppressant for him.  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 He declines at this time - "I don't want to take any pills."  Patient  Active Problem List   Diagnosis Date Noted  . 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 Cocaine abuse with cocaine-induced mood disorder (HCC) 10/02/2015  . Homicidal ideation   . Polysubstance abuse   . Suicidal ideation   . Paranoia (HCC) 01/14/2013  . Substance induced mood disorder (HCC) 01/14/2013  . RECTAL FISSURE 04/19/2010  . PANIC DISORDER,NO AGORAPHOBIA 03/08/2010  . ARTHROPATHY NOS, UNSPECIFIED SITE 03/08/2010  . BACK PAIN 02/14/2010    Past Medical History:  Diagnosis Date  . Anxiety   . Heart murmur   . MRSA (methicillin resistant staph aureus) culture positive   . Polysubstance abuse   . Staph infection 2006   left leg    Past Surgical History:  Procedure Laterality Date  . LEG SURGERY     MRSA removed  . WRIST TENODESIS     Right arm    Social History  Substance Use Topics  . Smoking status: Current Every Day Smoker    Packs/day: 0.50    Years: 5.00  . Smokeless tobacco: Never Used  . Alcohol use 0.0 oz/week     Comment: occassional, weekends mostly     Family History  Problem Relation Age of Onset  . Diabetes  Father   . Glaucoma Father   . Healthy Sister   . Healthy Brother   . Diabetes Maternal Grandmother   . Healthy Daughter     No Known Allergies  Medication list has been reviewed and updated.  Current Outpatient Prescriptions on File Prior to Visit  Medication Sig Dispense Refill  . ALPRAZolam (XANAX) 1 MG tablet Take 1/2 tablet twice daily as needed for anxiety. If used with vistaril take lower dose 30 tablet 0  . hydrOXYzine (VISTARIL) 50 MG capsule Take 50-100 mg by mouth at bedtime.     No current facility-administered medications on file prior to visit.     Review of Systems:  As per HPI- otherwise negative.   Physical Examination: Vitals:   04/27/16 1109  BP: (!) 159/98  Pulse: 72  Temp: 97.6 F (36.4 C)   Vitals:   04/27/16 1109  Weight: 262 lb 9.6 oz (119.1 kg)  Height: 6' (1.829 m)   Body mass  index is 35.61 kg/m. Ideal Body Weight: Weight in (lb) to have BMI = 25: 183.9  GEN: WDWN, NAD, Non-toxic, A & O x 3, obese, large build.    Looks well HEENT: Atraumatic, Normocephalic. Neck supple. No masses, No LAD.  Bilateral TM wnl, oropharynx normal.  PEERL,EOMI.   Ears and Nose: No external deformity. CV: RRR, No M/G/R. No JVD. No thrill. No extra heart sounds. PULM: CTA B, no wheezes, crackles, rhonchi. No retractions. No resp. distress. No accessory muscle use. EXTR: No c/c/e NEURO moving slowly PSYCH: Normally interactive. Conversant. Not depressed or anxious appearing.  Calm demeanor.  No bony cervical TTP, normal cervical rotation to right and left, flexion and extension He has tenderness to palpation over the coccyx  Normal BLE strength and DTR  Dg Cervical Spine Complete  Result Date: 04/27/2016 CLINICAL DATA:  36 year old male fell down a flight of stairs 4 days ago with cervical neck pain and stiffness. Pain in the pelvis and coccyx. Initial encounter. EXAM: CERVICAL SPINE - COMPLETE 4+ VIEW COMPARISON:  Cervical spine series 11/08/15. FINDINGS: Mildly improved cervical lordosis. Normal prevertebral soft tissue contour. Cervicothoracic junction alignment is within normal limits. Stable disc spaces. Bilateral posterior element alignment is within normal limits. Small right C7 cervical rib. Normal AP alignment. Negative lung apices. Normal C1-C2 alignment. IMPRESSION: No acute fracture or listhesis identified in the cervical spine. Electronically Signed   By: Odessa FlemingH  Hall M.D.   On: 04/27/2016 11:50   Dg Pelvis 1-2 Views  Result Date: 04/27/2016 CLINICAL DATA:  36 year old male fell down a flight of stairs 4 days ago with pain in the pelvis and coccyx. Initial encounter. EXAM: PELVIS - 1-2 VIEW COMPARISON:  CT Abdomen and Pelvis 02/10/2010. FINDINGS: Bone mineralization is within normal limits. Femoral heads normally located. Hip joint spaces remain normal. Grossly intact proximal femurs.  No pelvis fracture identified. The sacral ala and SI joints appear normal. The coccygeal segments partially project over the pubic symphysis but appear within normal limits. Negative visible bowel gas pattern. No acute osseous abnormality identified. IMPRESSION: No acute fracture or dislocation identified about the pelvis. Electronically Signed   By: Odessa FlemingH  Hall M.D.   On: 04/27/2016 11:49   Dg Sacrum/coccyx  Result Date: 04/27/2016 CLINICAL DATA:  Neck pain, stiffness, coccyx pain, status post fall down stairs on Sunday EXAM: SACRUM AND COCCYX - 2+ VIEW COMPARISON:  None. FINDINGS: There is no evidence of fracture or other focal bone lesions. IMPRESSION: Negative. Electronically Signed   By: Lang SnowLiviu  Pop M.D.   On: 04/27/2016 11:53     Assessment and Plan: Fall down stairs, initial encounter - Plan: meloxicam (MOBIC) 15 MG tablet  Pain in neck - Plan: DG Cervical Spine Complete, meloxicam (MOBIC) 15 MG tablet  Coccyx pain - Plan: DG Sacrum/Coccyx, DG Pelvis 1-2 Views, meloxicam (MOBIC) 15 MG tablet  GAD (generalized anxiety disorder) - Plan: ALPRAZolam (XANAX) 1 MG tablet  Borderline blood pressure  Weight gain  History of cocaine abuse  Here today after falling backwards on some steps several days ago.  He has complaint of tailbone pain.  I am not going to rx any narcotic pain medication due to his history of cocaine abuse rx for mobic to use as needed Did refill his xanax Encouraged him to think about BP medication but he is not interested at this time He plans to work on weight loss and will see me in  2 months   Signed Abbe Amsterdam, MD

## 2016-04-27 NOTE — Patient Instructions (Signed)
Use the mobic as needed for pain in your tailbone- you can take tylenol with this but avoid other NSAID medications such as aleve I also refilled your xanax today- continue to use sparingly as needed Your blood pressure is borderline. If this does not improve soon I would recommend that you think again about using a medication to help control your pressure Your tailbone will likely be sore for a few weeks- let me know if not gradually improving

## 2016-06-13 ENCOUNTER — Telehealth: Payer: Self-pay | Admitting: Family Medicine

## 2016-06-13 NOTE — Telephone Encounter (Signed)
Relation to NW:GNFApt:self Call back number:[phone] [id number] [place], [place] - [id number] [facility] [address] [address] [phone] (Phone) [phone] (Fax)     Reason for call:  Patient requesting a refill ALPRAZolam (XANAX) 1 MG tablet

## 2016-06-14 ENCOUNTER — Other Ambulatory Visit: Payer: Self-pay | Admitting: Emergency Medicine

## 2016-06-14 DIAGNOSIS — F411 Generalized anxiety disorder: Secondary | ICD-10-CM

## 2016-06-14 MED ORDER — ALPRAZOLAM 1 MG PO TABS: ORAL_TABLET | ORAL | 0 refills | Status: DC

## 2016-06-14 NOTE — Telephone Encounter (Signed)
Request sent to provider.

## 2016-06-14 NOTE — Telephone Encounter (Signed)
Received refill request from pt for ALPRAZolam (XANAX) 1 MG tablet. Last office visit and refill request 04/27/16. Is it ok to refill? Please advise.  [facility] [facility] [address]

## 2016-06-14 NOTE — Telephone Encounter (Signed)
Reviewed NCCSR- ok to fill 

## 2016-07-21 ENCOUNTER — Other Ambulatory Visit: Payer: Self-pay | Admitting: Family Medicine

## 2016-07-21 DIAGNOSIS — F411 Generalized anxiety disorder: Secondary | ICD-10-CM

## 2016-07-21 NOTE — Telephone Encounter (Signed)
NCCSR: xanax only.  Will refill

## 2016-07-25 DIAGNOSIS — H5010 Unspecified exotropia: Secondary | ICD-10-CM

## 2016-07-25 HISTORY — DX: Unspecified exotropia: H50.10

## 2016-07-28 ENCOUNTER — Encounter (HOSPITAL_BASED_OUTPATIENT_CLINIC_OR_DEPARTMENT_OTHER): Payer: Self-pay | Admitting: *Deleted

## 2016-07-31 ENCOUNTER — Ambulatory Visit: Payer: Self-pay | Admitting: Ophthalmology

## 2016-07-31 NOTE — H&P (Signed)
Date of examination:  07-06-16  Indication for surgery: to straighten the eyes and allow some binocularity  Pertinent past medical history:  Past Medical History:  Diagnosis Date  . Anxiety   . Dental crown present   . Exotropia of both eyes 07/2016  . GERD (gastroesophageal reflux disease)   . History of MRSA infection    left leg  . Seasonal allergies     Pertinent ocular history:  Eyes drift outward for many years, worse when tired.  Played football but no known head injury  Pertinent family history:  Family History  Problem Relation Age of Onset  . Diabetes Father   . Glaucoma Father   . Healthy Sister   . Healthy Brother   . Diabetes Maternal Grandmother   . Healthy Daughter     General:  Healthy appearing patient in no distress.    Eyes:    Acuity [place] OD 20/20  OS 20/20  External: Within normal limits     Anterior segment: Within normal limits     Motility:   X(T)=45 comitant.  X(T)'=45.  Rots nl  Fundus: deferred  Heart: Regular rate and rhythm without murmur     Lungs: Clear to auscultation      Impression:Intermittent exotropia, frequently manifest  Plan: Lateral rectus muscle recession both eyes  Kaylen Motl O  

## 2016-08-04 ENCOUNTER — Encounter (HOSPITAL_BASED_OUTPATIENT_CLINIC_OR_DEPARTMENT_OTHER): Payer: Self-pay

## 2016-08-04 ENCOUNTER — Ambulatory Visit (HOSPITAL_BASED_OUTPATIENT_CLINIC_OR_DEPARTMENT_OTHER)
Admission: RE | Admit: 2016-08-04 | Discharge: 2016-08-04 | Disposition: A | Discharge status: Home / Self Care | Payer: Federal, State, Local not specified - PPO | Source: Ambulatory Visit | Attending: Ophthalmology | Admitting: Ophthalmology

## 2016-08-04 ENCOUNTER — Ambulatory Visit (HOSPITAL_BASED_OUTPATIENT_CLINIC_OR_DEPARTMENT_OTHER): Payer: Federal, State, Local not specified - PPO | Admitting: Anesthesiology

## 2016-08-04 ENCOUNTER — Encounter (HOSPITAL_BASED_OUTPATIENT_CLINIC_OR_DEPARTMENT_OTHER)
Admission: RE | Disposition: A | Discharge status: Home / Self Care | Payer: Self-pay | Source: Ambulatory Visit | Attending: Ophthalmology

## 2016-08-04 DIAGNOSIS — F1490 Cocaine use, unspecified, uncomplicated: Secondary | ICD-10-CM | POA: Insufficient documentation

## 2016-08-04 DIAGNOSIS — H5010 Unspecified exotropia: Secondary | ICD-10-CM | POA: Insufficient documentation

## 2016-08-04 DIAGNOSIS — F419 Anxiety disorder, unspecified: Secondary | ICD-10-CM | POA: Insufficient documentation

## 2016-08-04 DIAGNOSIS — Z8614 Personal history of Methicillin resistant Staphylococcus aureus infection: Secondary | ICD-10-CM | POA: Insufficient documentation

## 2016-08-04 DIAGNOSIS — K219 Gastro-esophageal reflux disease without esophagitis: Secondary | ICD-10-CM | POA: Insufficient documentation

## 2016-08-04 DIAGNOSIS — F17210 Nicotine dependence, cigarettes, uncomplicated: Secondary | ICD-10-CM | POA: Insufficient documentation

## 2016-08-04 HISTORY — DX: Gastro-esophageal reflux disease without esophagitis: K21.9

## 2016-08-04 HISTORY — DX: Dental restoration status: Z98.811

## 2016-08-04 HISTORY — PX: STRABISMUS SURGERY: SHX218

## 2016-08-04 HISTORY — DX: Personal history of Methicillin resistant Staphylococcus aureus infection: Z86.14

## 2016-08-04 HISTORY — DX: Unspecified exotropia: H50.10

## 2016-08-04 HISTORY — DX: Other seasonal allergic rhinitis: J30.2

## 2016-08-04 SURGERY — STRABISMUS SURGERY, BILATERAL
Anesthesia: General | Site: Eye | Laterality: Bilateral

## 2016-08-04 MED ORDER — MIDAZOLAM HCL 2 MG/2ML IJ SOLN
1.0000 mg | INTRAMUSCULAR | Status: DC | PRN
  Administered 2016-08-04: 2 mg via INTRAVENOUS

## 2016-08-04 MED ORDER — DEXAMETHASONE SODIUM PHOSPHATE 10 MG/ML IJ SOLN
INTRAMUSCULAR | Status: AC
  Filled 2016-08-04: qty 1

## 2016-08-04 MED ORDER — MIDAZOLAM HCL 2 MG/2ML IJ SOLN
INTRAMUSCULAR | Status: AC
  Filled 2016-08-04: qty 2

## 2016-08-04 MED ORDER — ONDANSETRON HCL 4 MG/2ML IJ SOLN
INTRAMUSCULAR | Status: AC
  Filled 2016-08-04: qty 2

## 2016-08-04 MED ORDER — MEPERIDINE HCL 25 MG/ML IJ SOLN: 6.2500 mg | INTRAMUSCULAR | Status: DC | PRN

## 2016-08-04 MED ORDER — DEXAMETHASONE SODIUM PHOSPHATE 4 MG/ML IJ SOLN
INTRAMUSCULAR | Status: DC | PRN
  Administered 2016-08-04: 10 mg via INTRAVENOUS

## 2016-08-04 MED ORDER — FENTANYL CITRATE (PF) 100 MCG/2ML IJ SOLN
INTRAMUSCULAR | Status: AC
  Filled 2016-08-04: qty 2

## 2016-08-04 MED ORDER — FENTANYL CITRATE (PF) 100 MCG/2ML IJ SOLN
50.0000 ug | INTRAMUSCULAR | Status: AC | PRN
  Administered 2016-08-04: 50 ug via INTRAVENOUS
  Administered 2016-08-04: 100 ug via INTRAVENOUS
  Administered 2016-08-04: 50 ug via INTRAVENOUS

## 2016-08-04 MED ORDER — KETOROLAC TROMETHAMINE 30 MG/ML IJ SOLN
INTRAMUSCULAR | Status: AC
  Filled 2016-08-04: qty 1

## 2016-08-04 MED ORDER — LIDOCAINE 2% (20 MG/ML) 5 ML SYRINGE
INTRAMUSCULAR | Status: AC
  Filled 2016-08-04: qty 5

## 2016-08-04 MED ORDER — METOCLOPRAMIDE HCL 5 MG/ML IJ SOLN: 10.0000 mg | Freq: Once | INTRAMUSCULAR | Status: DC | PRN

## 2016-08-04 MED ORDER — FENTANYL CITRATE (PF) 100 MCG/2ML IJ SOLN
25.0000 ug | INTRAMUSCULAR | Status: DC | PRN
  Administered 2016-08-04 (×2): 50 ug via INTRAVENOUS
  Administered 2016-08-04 (×2): 25 ug via INTRAVENOUS

## 2016-08-04 MED ORDER — LIDOCAINE 2% (20 MG/ML) 5 ML SYRINGE
INTRAMUSCULAR | Status: DC | PRN
  Administered 2016-08-04: 60 mg via INTRAVENOUS

## 2016-08-04 MED ORDER — LACTATED RINGERS IV SOLN
INTRAVENOUS | Status: DC
  Administered 2016-08-04 (×2): via INTRAVENOUS

## 2016-08-04 MED ORDER — SCOPOLAMINE 1 MG/3DAYS TD PT72: 1.0000 | MEDICATED_PATCH | Freq: Once | TRANSDERMAL | Status: DC | PRN

## 2016-08-04 MED ORDER — PROPOFOL 10 MG/ML IV BOLUS
INTRAVENOUS | Status: DC | PRN
  Administered 2016-08-04: 250 mg via INTRAVENOUS

## 2016-08-04 MED ORDER — GLYCOPYRROLATE 0.2 MG/ML IJ SOLN
INTRAMUSCULAR | Status: DC | PRN
  Administered 2016-08-04: .4 mg via INTRAVENOUS

## 2016-08-04 MED ORDER — LACTATED RINGERS IV SOLN: INTRAVENOUS | Status: DC

## 2016-08-04 MED ORDER — KETOROLAC TROMETHAMINE 30 MG/ML IJ SOLN
INTRAMUSCULAR | Status: DC | PRN
  Administered 2016-08-04: 30 mg via INTRAVENOUS

## 2016-08-04 MED ORDER — ONDANSETRON HCL 4 MG/2ML IJ SOLN
INTRAMUSCULAR | Status: DC | PRN
  Administered 2016-08-04: 4 mg via INTRAVENOUS

## 2016-08-04 MED ORDER — TOBRAMYCIN-DEXAMETHASONE 0.3-0.1 % OP OINT: 1.0000 "application " | TOPICAL_OINTMENT | Freq: Two times a day (BID) | OPHTHALMIC | 0 refills | Status: DC

## 2016-08-04 SURGICAL SUPPLY — 33 items
APL SRG 3 HI ABS STRL LF PLS (MISCELLANEOUS) ×1
APPLICATOR COTTON TIP 6IN STRL (MISCELLANEOUS) ×8 IMPLANT
APPLICATOR DR MATTHEWS STRL (MISCELLANEOUS) ×2 IMPLANT
BANDAGE EYE OVAL (MISCELLANEOUS) IMPLANT
CAUTERY EYE LOW TEMP 1300F FIN (OPHTHALMIC RELATED) IMPLANT
COVER BACK TABLE 60X90IN (DRAPES) ×2 IMPLANT
COVER MAYO STAND STRL (DRAPES) ×2 IMPLANT
DRAPE SURG 17X23 STRL (DRAPES) ×4 IMPLANT
DRAPE U-SHAPE 76X120 STRL (DRAPES) ×1 IMPLANT
GLOVE BIO SURGEON STRL SZ 6.5 (GLOVE) ×6 IMPLANT
GLOVE BIO SURGEON STRL SZ8 (GLOVE) ×1 IMPLANT
GLOVE BIOGEL M STRL SZ7.5 (GLOVE) ×1 IMPLANT
GLOVE SURG SS PI 6.5 STRL IVOR (GLOVE) ×1 IMPLANT
GOWN STRL REUS W/ TWL LRG LVL3 (GOWN DISPOSABLE) ×1 IMPLANT
GOWN STRL REUS W/TWL LRG LVL3 (GOWN DISPOSABLE) ×6
GOWN STRL REUS W/TWL XL LVL3 (GOWN DISPOSABLE) ×4 IMPLANT
NS IRRIG 1000ML POUR BTL (IV SOLUTION) ×2 IMPLANT
PACK BASIN DAY SURGERY FS (CUSTOM PROCEDURE TRAY) ×2 IMPLANT
SHEET MEDIUM DRAPE 40X70 STRL (DRAPES) ×1 IMPLANT
SLEEVE SCD COMPRESS KNEE MED (MISCELLANEOUS) ×2 IMPLANT
SPEAR EYE SURG WECK-CEL (MISCELLANEOUS) ×4 IMPLANT
STRIP CLOSURE SKIN 1/4X4 (GAUZE/BANDAGES/DRESSINGS) IMPLANT
SUT 6 0 SILK T G140 8DA (SUTURE) IMPLANT
SUT MERSILENE 6-0 18IN S14 8MM (SUTURE)
SUT PLAIN 6 0 TG1408 (SUTURE) ×1 IMPLANT
SUT SILK 4 0 C 3 735G (SUTURE) IMPLANT
SUT VICRYL 6 0 S 28 (SUTURE) IMPLANT
SUT VICRYL ABS 6-0 S29 18IN (SUTURE) ×2 IMPLANT
SUTURE MERSLN 6-0 18IN S14 8MM (SUTURE) IMPLANT
SYR 10ML LL (SYRINGE) ×2 IMPLANT
SYR TB 1ML LL NO SAFETY (SYRINGE) ×2 IMPLANT
TOWEL OR 17X24 6PK STRL BLUE (TOWEL DISPOSABLE) ×2 IMPLANT
TRAY DSU PREP LF (CUSTOM PROCEDURE TRAY) ×2 IMPLANT

## 2016-08-04 NOTE — Anesthesia Procedure Notes (Signed)
Procedure Name: LMA Insertion Performed by: Briyanna Billingham W Pre-anesthesia Checklist: Patient identified, Emergency Drugs available, Suction available and Patient being monitored Patient Re-evaluated:Patient Re-evaluated prior to inductionOxygen Delivery Method: Circle system utilized Preoxygenation: Pre-oxygenation with 100% oxygen Intubation Type: IV induction Ventilation: Mask ventilation without difficulty LMA: LMA flexible inserted LMA Size: 4.0 Number of attempts: 1 Placement Confirmation: positive ETCO2 Tube secured with: Tape Dental Injury: Teeth and Oropharynx as per pre-operative assessment        

## 2016-08-04 NOTE — Op Note (Signed)
08/04/2016  1:01 PM  PATIENT:  Ryan FredericksonEdward L Oliver  36 y.o. male  PRE-OPERATIVE DIAGNOSIS:  Exotropia      POST-OPERATIVE DIAGNOSIS:  Exotropia     PROCEDURE:  Lateral rectus muscle recession 8.5 mm both eye(s)  SURGEON:  Pasty SpillersWilliam O.Maple HudsonYoung, M.D.   ANESTHESIA:   general  COMPLICATIONS:None  DESCRIPTION OF PROCEDURE: The patient was taken to the operating room where He was identified by me. General anesthesia was induced without difficulty after placement of appropriate monitors. The patient was prepped and draped in standard sterile fashion. A lid speculum was placed in the right eye.  Through an inferotemporal fornix incision through conjunctiva and Tenon's fascia, the right lateral rectus muscle was engaged on a series of muscle hooks and cleared of its fascial attachments. The tendon was secured with a double-armed 6-0 Vicryl suture with a double locking bite at each border of the muscle, 1 mm from the insertion. The muscle was disinserted, and was reattached to sclera at a measured distance of 8.5 millimeters posterior to the original insertion, using direct scleral passes in crossed swords fashion.  The suture ends were tied securely after the position of the muscle had been checked and found to be accurate. Conjunctiva was closed with one 6-0 plain gut suture.  The speculum was transferred to the left eye, where an identical procedure was performed, again effecting a 8.5 millimeters recession of the lateral rectus muscle. TobraDex ointment was placed in both eyes. The patient was awakened without difficulty and taken to the recovery room in stable condition, having suffered no intraoperative or immediate postoperative complications.  Pasty SpillersWilliam O. Isabeau Mccalla M.D.    PATIENT DISPOSITION:  PACU - hemodynamically stable.

## 2016-08-04 NOTE — H&P (View-Only) (Signed)
Date of examination:  07-06-16  Indication for surgery: to straighten the eyes and allow some binocularity  Pertinent past medical history:  Past Medical History:  Diagnosis Date  . Anxiety   . Dental crown present   . Exotropia of both eyes 07/2016  . GERD (gastroesophageal reflux disease)   . History of MRSA infection    left leg  . Seasonal allergies     Pertinent ocular history:  Eyes drift outward for many years, worse when tired.  Played football but no known head injury  Pertinent family history:  Family History  Problem Relation Age of Onset  . Diabetes Father   . Glaucoma Father   . Healthy Sister   . Healthy Brother   . Diabetes Maternal Grandmother   . Healthy Daughter     General:  Healthy appearing patient in no distress.    Eyes:    Acuity [place] OD 20/20  OS 20/20  External: Within normal limits     Anterior segment: Within normal limits     Motility:   X(T)=45 comitant.  X(T)'=45.  Rots nl  Fundus: deferred  Heart: Regular rate and rhythm without murmur     Lungs: Clear to auscultation      Impression:Intermittent exotropia, frequently manifest  Plan: Lateral rectus muscle recession both eyes  [name] O

## 2016-08-04 NOTE — Anesthesia Preprocedure Evaluation (Signed)
Anesthesia Evaluation  Patient identified by MRN, date of birth, ID band Patient awake    Reviewed: Allergy & Precautions, NPO status , Patient's Chart, lab work & pertinent test results  Airway Mallampati: II  TM Distance: >3 FB Neck ROM: Full    Dental no notable dental hx.    Pulmonary Current Smoker,    Pulmonary exam normal breath sounds clear to auscultation       Cardiovascular negative cardio ROS Normal cardiovascular exam Rhythm:Regular Rate:Normal     Neuro/Psych negative neurological ROS  negative psych ROS   GI/Hepatic negative GI ROS, (+)     substance abuse  cocaine use,   Endo/Other  negative endocrine ROS  Renal/GU negative Renal ROS  negative genitourinary   Musculoskeletal negative musculoskeletal ROS (+)   Abdominal   Peds negative pediatric ROS (+)  Hematology negative hematology ROS (+)   Anesthesia Other Findings   Reproductive/Obstetrics negative OB ROS                             Anesthesia Physical Anesthesia Plan  ASA: II  Anesthesia Plan: General   Post-op Pain Management:    Induction: Intravenous  Airway Management Planned: LMA and Oral ETT  Additional Equipment:   Intra-op Plan:   Post-operative Plan: Extubation in OR  Informed Consent: I have reviewed the patients History and Physical, chart, labs and discussed the procedure including the risks, benefits and alternatives for the proposed anesthesia with the patient or authorized representative who has indicated his/her understanding and acceptance.   Dental advisory given  Plan Discussed with: CRNA  Anesthesia Plan Comments:         Anesthesia Quick Evaluation

## 2016-08-04 NOTE — Interval H&P Note (Signed)
History and Physical Interval Note:  08/04/2016 11:53 AM  [name]  has presented today for surgery, with the diagnosis of EXOTROPIA  The various methods of treatment have been discussed with the patient and family. After consideration of risks, benefits and other options for treatment, the patient has consented to  Procedure(s): REPAIR STRABISMUS BILATERAL (Bilateral) as a surgical intervention .  The patient's history has been reviewed, patient examined, no change in status, stable for surgery.  I have reviewed the patient's chart and labs.  Questions were answered to the patient's satisfaction.     Shara BlazingYOUNG,Assad Harbeson O

## 2016-08-04 NOTE — Transfer of Care (Signed)
Immediate Anesthesia Transfer of Care Note  Patient: [name]  Procedure(s) Performed: Procedure(s): REPAIR STRABISMUS BILATERAL (Bilateral)  Patient Location: PACU  Anesthesia Type:General  Level of Consciousness: sedated, drowsy and responds to stimulation  Airway & Oxygen Therapy: Patient Spontanous Breathing and Patient connected to face mask oxygen  Post-op Assessment: Report given to RN and Post -op Vital signs reviewed and stable  Post vital signs: Reviewed and stable  Last Vitals:  Vitals:   08/04/16 1100  BP: (!) 147/98  Pulse: 68  Resp: 18  Temp: 36.5 C    Last Pain:  Vitals:   08/04/16 1100  TempSrc: Oral         Complications: No apparent anesthesia complications

## 2016-08-04 NOTE — Anesthesia Postprocedure Evaluation (Signed)
Anesthesia Post Note  Patient: [name]  Procedure(s) Performed: Procedure(s) (LRB): REPAIR STRABISMUS BILATERAL (Bilateral)  Patient location during evaluation: PACU Anesthesia Type: General Level of consciousness: awake and alert Pain management: pain level controlled Vital Signs Assessment: post-procedure vital signs reviewed and stable Respiratory status: spontaneous breathing, nonlabored ventilation, respiratory function stable and patient connected to nasal cannula oxygen Cardiovascular status: blood pressure returned to baseline and stable Postop Assessment: no signs of nausea or vomiting Anesthetic complications: no       Last Vitals:  Vitals:   08/04/16 1400 08/04/16 1428  BP: 131/81 (!) 140/99  Pulse: 75 70  Resp: 18 18  Temp:  36.7 C    Last Pain:  Vitals:   08/04/16 1428  TempSrc: Oral  PainSc: 7                  Phillips Groutarignan, Reinhard Schack

## 2016-08-04 NOTE — H&P (Signed)
Interval History and Physical Examination:  [name]  08/04/2016  Date of Initial H&P: 07-06-16   The patient has been reexamined. The H&P has been reviewed. There is no change in the plan of care.  The patient has no new complaints. The indications for today's procedure remain valid. There are no medical contraindications for proceeding with today's planned surgery and we will proceed as planned.  Ryan CarrowYOUNG,[name] OMD

## 2016-08-04 NOTE — Discharge Instructions (Signed)
Diet: Clear liquids, advance to soft foods then regular diet as tolerated by this evening. ° °Pain control:  ° 1)  Ibuprofen 600 mg by mouth every 6-8 hours as needed for pain ° 2)  Ice pack/cold compress to operated eye(s) as desired ° °Eye medications:  Tobradex or Zylet eye ointment 1/2 inch in operated eye(s) twice a day if directed to do so by Dr. Young ° °Activity: No swimming for 1 week.  It is OK to let water run over the face and eyes while showering or taking a bath, even during the first week.  No other restriction on exercise or activity. ° ° °Call Dr. Young's office [phone] with any problems or concerns. ° ° ° ° °Post Anesthesia Home Care Instructions ° °Activity: °Get plenty of rest for the remainder of the day. A responsible individual must stay with you for 24 hours following the procedure.  °For the next 24 hours, DO NOT: °-Drive a car °-Operate machinery °-Drink alcoholic beverages °-Take any medication unless instructed by your physician °-Make any legal decisions or sign important papers. ° °Meals: °Start with liquid foods such as gelatin or soup. Progress to regular foods as tolerated. Avoid greasy, spicy, heavy foods. If nausea and/or vomiting occur, drink only clear liquids until the nausea and/or vomiting subsides. Call your physician if vomiting continues. ° °Special Instructions/Symptoms: °Your throat may feel dry or sore from the anesthesia or the breathing tube placed in your throat during surgery. If this causes discomfort, gargle with warm salt water. The discomfort should disappear within 24 hours. ° °If you had a scopolamine patch placed behind your ear for the management of post- operative nausea and/or vomiting: ° °1. The medication in the patch is effective for 72 hours, after which it should be removed.  Wrap patch in a tissue and discard in the trash. Wash hands thoroughly with soap and water. °2. You may remove the patch earlier than 72 hours if you experience unpleasant  side effects which may include dry mouth, dizziness or visual disturbances. °3. Avoid touching the patch. Wash your hands with soap and water after contact with the patch. °   ° °

## 2016-08-07 ENCOUNTER — Encounter (HOSPITAL_BASED_OUTPATIENT_CLINIC_OR_DEPARTMENT_OTHER): Payer: Self-pay | Admitting: Ophthalmology

## 2016-08-14 ENCOUNTER — Encounter (HOSPITAL_COMMUNITY): Payer: Self-pay | Admitting: Family Medicine

## 2016-08-14 ENCOUNTER — Ambulatory Visit (HOSPITAL_COMMUNITY)
Admission: EM | Admit: 2016-08-14 | Discharge: 2016-08-14 | Disposition: A | Discharge status: Home / Self Care | Payer: Federal, State, Local not specified - PPO | Attending: Family Medicine | Admitting: Family Medicine

## 2016-08-14 DIAGNOSIS — L03211 Cellulitis of face: Secondary | ICD-10-CM

## 2016-08-14 MED ORDER — CEFTRIAXONE SODIUM 1 G IJ SOLR
1.0000 g | Freq: Once | INTRAMUSCULAR | Status: AC
  Administered 2016-08-14: 1 g via INTRAMUSCULAR

## 2016-08-14 MED ORDER — ONDANSETRON 8 MG PO TBDP: 8.0000 mg | ORAL_TABLET | Freq: Three times a day (TID) | ORAL | 0 refills | Status: DC | PRN

## 2016-08-14 MED ORDER — DOXYCYCLINE HYCLATE 100 MG PO TABS: 100.0000 mg | ORAL_TABLET | Freq: Two times a day (BID) | ORAL | 0 refills | Status: DC

## 2016-08-14 MED ORDER — HYDROCODONE-ACETAMINOPHEN 5-325 MG PO TABS: 1.0000 | ORAL_TABLET | Freq: Four times a day (QID) | ORAL | 0 refills | Status: DC | PRN

## 2016-08-14 MED ORDER — CEFTRIAXONE SODIUM 1 G IJ SOLR
INTRAMUSCULAR | Status: AC
  Filled 2016-08-14: qty 10

## 2016-08-14 NOTE — ED Provider Notes (Signed)
[facility]    CSN: [id number] Arrival date & time: 08/14/16  [id number]     History   Chief Complaint Chief Complaint  Patient presents with  . Facial Pain    HPI [name] is a 36 y.o. male.   This is a 36 year old man who works in a [employer], [employer] company. He's had 4 days of swelling in his left cheek with green purulent drainage. He complains of a headache and associated nausea as well. He has a history of MRSA.      Past Medical History:  Diagnosis Date  . Anxiety   . Dental crown present   . Exotropia of both eyes 07/2016  . GERD (gastroesophageal reflux disease)   . History of MRSA infection    left leg  . Seasonal allergies     Patient Active Problem List   Diagnosis Date Noted  . 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 Cocaine abuse with cocaine-induced mood disorder (HCC) 10/02/2015  . Homicidal ideation   . Polysubstance abuse   . Suicidal ideation   . Paranoia (HCC) 01/14/2013  . Substance induced mood disorder (HCC) 01/14/2013  . RECTAL FISSURE 04/19/2010  . PANIC DISORDER,NO AGORAPHOBIA 03/08/2010  . ARTHROPATHY NOS, UNSPECIFIED SITE 03/08/2010  . BACK PAIN 02/14/2010    Past Surgical History:  Procedure Laterality Date  . ABSCESS DRAINAGE Left    leg - MRSA  . STRABISMUS SURGERY Bilateral 08/04/2016   Procedure: REPAIR STRABISMUS BILATERAL;  Surgeon: Verne CarrowYoung, William, MD;  Location: [facility];  Service: Ophthalmology;  Laterality: Bilateral;  . TENDON REPAIR Right    hand       Home Medications    Prior to Admission medications   Medication Sig Start Date End Date Taking? Authorizing Provider  ALPRAZolam (XANAX) 1 MG tablet TAKE 1/2 TABLET BY MOUTH TWICE DAILY AS NEEDED FOR ANXIETY 07/21/16   Copland, Gwenlyn FoundJessica C, MD  doxycycline (VIBRA-TABS) 100 MG tablet Take 1 tablet (100 mg total) by mouth 2 (two) times daily. 08/14/16   Elvina SidleLauenstein, Ron Junco, MD  HYDROcodone-acetaminophen (NORCO) 5-325 MG tablet  Take 1 tablet by mouth every 6 (six) hours as needed for moderate pain. 08/14/16   Elvina SidleLauenstein, Hae Ahlers, MD  ondansetron (ZOFRAN-ODT) 8 MG disintegrating tablet Take 1 tablet (8 mg total) by mouth every 8 (eight) hours as needed for nausea. 08/14/16   Elvina SidleLauenstein, Noheli Melder, MD  ranitidine (ZANTAC) 150 MG tablet Take 150 mg by mouth 2 (two) times daily.    [provider]  tobramycin-dexamethasone Wallene Dales(TOBRADEX) ophthalmic ointment Place 1 application into both eyes 2 (two) times daily. 08/04/16   Verne CarrowYoung, William, MD    Family History Family History  Problem Relation Age of Onset  . Diabetes Father   . Glaucoma Father   . Healthy Sister   . Healthy Brother   . Diabetes Maternal Grandmother   . Healthy Daughter     Social History Social History  Substance Use Topics  . Smoking status: Current Every Day Smoker    Packs/day: 0.50    Years: 10.00    Types: Cigarettes  . Smokeless tobacco: Never Used  . Alcohol use 0.0 oz/week     Comment: 1-2 x/month     Allergies   Patient has no known allergies.   Review of Systems Review of Systems  Constitutional: Positive for fatigue.  HENT: Positive for facial swelling.   Gastrointestinal: Positive for diarrhea and nausea.  Neurological: Positive for headaches.  All other systems reviewed  and are negative.    Physical Exam Triage Vital Signs ED Triage Vitals  Enc Vitals Group     BP 08/14/16 1016 (!) 163/82     Pulse Rate 08/14/16 1016 70     Resp 08/14/16 1016 18     Temp 08/14/16 1016 98.6 F (37 C)     Temp Source 08/14/16 1016 Oral     SpO2 08/14/16 1016 98 %     Weight --      Height --      Head Circumference --      Peak Flow --      Pain Score 08/14/16 1018 7     Pain Loc --      Pain Edu? --      Excl. in GC? --    No data found.   Updated Vital Signs BP (!) 163/82 (BP Location: Right Arm)   Pulse 70   Temp 98.6 F (37 C) (Oral)   Resp 18   SpO2 98%    Physical Exam  Constitutional: He is oriented to  person, place, and time. He appears well-developed and well-nourished.  HENT:  Right Ear: External ear normal.  Left Ear: External ear normal.  Mouth/Throat: Oropharynx is clear and moist.  Left cheek is swollen with no fluctuance. There is a mid cheek area of purulent drainage  Eyes: Conjunctivae are normal. Pupils are equal, round, and reactive to light.  Neck: Normal range of motion. Neck supple.  Pulmonary/Chest: Effort normal.  Musculoskeletal: Normal range of motion.  Neurological: He is alert and oriented to person, place, and time.  Skin: Skin is warm and dry.  Nursing note and vitals reviewed.    UC Treatments / Results  Labs (all labs ordered are listed, but only abnormal results are displayed) Labs Reviewed - No data to display  EKG  EKG Interpretation None       Radiology No results found.  Procedures Procedures (including critical care time)  Medications Ordered in UC Medications  cefTRIAXone (ROCEPHIN) injection 1 g (not administered)     Initial Impression / Assessment and Plan / UC Course  I have reviewed the triage vital signs and the nursing notes.  Pertinent labs & imaging results that were available during my care of the patient were reviewed by me and considered in my medical decision making (see chart for details).     Final Clinical Impressions(s) / UC Diagnoses   Final diagnoses:  Facial cellulitis    New Prescriptions New Prescriptions   DOXYCYCLINE (VIBRA-TABS) 100 MG TABLET    Take 1 tablet (100 mg total) by mouth 2 (two) times daily.   HYDROCODONE-ACETAMINOPHEN (NORCO) 5-325 MG TABLET    Take 1 tablet by mouth every 6 (six) hours as needed for moderate pain.   ONDANSETRON (ZOFRAN-ODT) 8 MG DISINTEGRATING TABLET    Take 1 tablet (8 mg total) by mouth every 8 (eight) hours as needed for nausea.     Elvina Sidle, MD 08/14/16 1026

## 2016-08-14 NOTE — Discharge Instructions (Signed)
Follow up with Dr. Patsy Lageropland Wednesday or Thursday

## 2016-08-14 NOTE — ED Triage Notes (Signed)
Pt  Noticed   Some   Swelling   l  Side  Side  Of face   X  4  Days   Got  Worse   Several  Days  Ago      Swelling  Increased      Had   Some  Nausea   And  Diarrhea         History  Of  mrsa

## 2016-08-15 ENCOUNTER — Emergency Department (HOSPITAL_BASED_OUTPATIENT_CLINIC_OR_DEPARTMENT_OTHER)
Admission: EM | Admit: 2016-08-15 | Discharge: 2016-08-15 | Disposition: A | Discharge status: Home / Self Care | Payer: Federal, State, Local not specified - PPO | Attending: Emergency Medicine | Admitting: Emergency Medicine

## 2016-08-15 DIAGNOSIS — F17210 Nicotine dependence, cigarettes, uncomplicated: Secondary | ICD-10-CM | POA: Insufficient documentation

## 2016-08-15 DIAGNOSIS — Z79899 Other long term (current) drug therapy: Secondary | ICD-10-CM | POA: Diagnosis not present

## 2016-08-15 DIAGNOSIS — L0201 Cutaneous abscess of face: Secondary | ICD-10-CM | POA: Diagnosis not present

## 2016-08-15 DIAGNOSIS — R51 Headache: Secondary | ICD-10-CM | POA: Diagnosis present

## 2016-08-15 MED ORDER — OXYCODONE-ACETAMINOPHEN 5-325 MG PO TABS: 1.0000 | ORAL_TABLET | ORAL | 0 refills | Status: DC | PRN

## 2016-08-15 MED ORDER — OXYCODONE-ACETAMINOPHEN 5-325 MG PO TABS
1.0000 | ORAL_TABLET | Freq: Once | ORAL | Status: AC
  Administered 2016-08-15: 1 via ORAL
  Filled 2016-08-15: qty 1

## 2016-08-15 MED ORDER — ONDANSETRON 4 MG PO TBDP
4.0000 mg | ORAL_TABLET | Freq: Once | ORAL | Status: AC
  Administered 2016-08-15: 4 mg via ORAL
  Filled 2016-08-15: qty 1

## 2016-08-15 MED FILL — OXYCODONE/APAP 5/325 MG TAB: 5-325 | 1 days supply | Qty: 8 | Fill #0

## 2016-08-15 NOTE — ED Provider Notes (Signed)
[facility] Provider Note   CSN: [id number] Arrival date & time: 08/15/16  1155     History   Chief Complaint Chief Complaint  Patient presents with  . Abscess    HPI [name] is a 36 y.o. male with history of MRSA who presents today for facial pain  5 days. He was seen at [facility] urgent care center yesterday where he was diagnosed with facial cellulitis, given a shot of Rocephin and prescribed doxycycline, Norco, Zofran. He presents today for ongoing pain, headache and nausea. He describes the pain as a swelling pressure in his left cheek that extends from his left ear to the corner of his left lips along the entire cheek. Mild tingling associated. It is a constant pain that is not relieved with Norco. Notes that it was previously draining green pus but has stopped over the last few days. He denies fever or chills, ear pain, ear discharge, nasal discharge, nasal pain, sore throat, difficulty swallowing, shortness of breath, vomiting. To note the patient recently had a bilateral strabismus on 08/04/2016 in the hospital by ophthalmology.  HPI  Past Medical History:  Diagnosis Date  . Anxiety   . Dental crown present   . Exotropia of both eyes 07/2016  . GERD (gastroesophageal reflux disease)   . History of MRSA infection    left leg  . Seasonal allergies     Patient Active Problem List   Diagnosis Date Noted  . 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 Cocaine abuse with cocaine-induced mood disorder (HCC) 10/02/2015  . Homicidal ideation   . Polysubstance abuse   . Suicidal ideation   . Paranoia (HCC) 01/14/2013  . Substance induced mood disorder (HCC) 01/14/2013  . RECTAL FISSURE 04/19/2010  . PANIC DISORDER,NO AGORAPHOBIA 03/08/2010  . ARTHROPATHY NOS, UNSPECIFIED SITE 03/08/2010  . BACK PAIN 02/14/2010    Past Surgical History:  Procedure Laterality Date  . ABSCESS DRAINAGE Left    leg - MRSA  . STRABISMUS SURGERY Bilateral  08/04/2016   Procedure: REPAIR STRABISMUS BILATERAL;  Surgeon: Verne Carrow, MD;  Location: [facility];  Service: Ophthalmology;  Laterality: Bilateral;  . TENDON REPAIR Right    hand       Home Medications    Prior to Admission medications   Medication Sig Start Date End Date Taking? Authorizing Provider  ALPRAZolam (XANAX) 1 MG tablet TAKE 1/2 TABLET BY MOUTH TWICE DAILY AS NEEDED FOR ANXIETY 07/21/16   Copland, Gwenlyn Found, MD  doxycycline (VIBRA-TABS) 100 MG tablet Take 1 tablet (100 mg total) by mouth 2 (two) times daily. 08/14/16   Elvina Sidle, MD  HYDROcodone-acetaminophen (NORCO) 5-325 MG tablet Take 1 tablet by mouth every 6 (six) hours as needed for moderate pain. 08/14/16   Elvina Sidle, MD  ondansetron (ZOFRAN-ODT) 8 MG disintegrating tablet Take 1 tablet (8 mg total) by mouth every 8 (eight) hours as needed for nausea. 08/14/16   Elvina Sidle, MD  oxyCODONE-acetaminophen (PERCOCET/ROXICET) 5-325 MG tablet Take 1-2 tablets by mouth every 4 (four) hours as needed for severe pain. 08/15/16   Jamahl Lemmons, Elmer Sow, PA-C  ranitidine (ZANTAC) 150 MG tablet Take 150 mg by mouth 2 (two) times daily.    [provider]    Family History Family History  Problem Relation Age of Onset  . Diabetes Father   . Glaucoma Father   . Healthy Sister   . Healthy Brother   . Diabetes Maternal Grandmother   .  Healthy Daughter     Social History Social History  Substance Use Topics  . Smoking status: Current Every Day Smoker    Packs/day: 0.50    Years: 10.00    Types: Cigarettes  . Smokeless tobacco: Never Used  . Alcohol use 0.0 oz/week     Comment: 1-2 x/month     Allergies   Patient has no known allergies.   Review of Systems Review of Systems  All other systems reviewed and are negative.    Physical Exam Updated Vital Signs BP (!) 169/105 (BP Location: Right Arm)   Pulse 72   Temp 98.1 F (36.7 C) (Oral)   Resp 18   Ht 6' (1.829 m)    Wt 114.3 kg (251 lb 14.4 oz)   SpO2 99%   BMI 34.16 kg/m   Physical Exam  Constitutional: He appears well-developed and well-nourished.  HENT:  Head: Normocephalic and atraumatic.    Right Ear: Hearing, tympanic membrane, external ear and ear canal normal.  Left Ear: Hearing, tympanic membrane, external ear and ear canal normal.  Nose: Nose normal. Right sinus exhibits no maxillary sinus tenderness and no frontal sinus tenderness. Left sinus exhibits no maxillary sinus tenderness and no frontal sinus tenderness.  Mouth/Throat: Uvula is midline, oropharynx is clear and moist and mucous membranes are normal. No dental abscesses or dental caries.  Eyes: EOM and lids are normal. Right eye exhibits no discharge. Left eye exhibits no discharge. Right conjunctiva is injected. Left conjunctiva is injected. No scleral icterus.  Neck: Normal range of motion and full passive range of motion without pain. Neck supple.  Pulmonary/Chest: Effort normal. No respiratory distress.  Lymphadenopathy:    He has no cervical adenopathy.  Neurological: He is alert.  Skin: No pallor.  Psychiatric: He has a normal mood and affect.  Nursing note and vitals reviewed.    ED Treatments / Results  Labs (all labs ordered are listed, but only abnormal results are displayed) Labs Reviewed - No data to display  EKG  EKG Interpretation None       Radiology No results found.  Procedures Procedures (including critical care time)  Medications Ordered in ED Medications  oxyCODONE-acetaminophen (PERCOCET/ROXICET) 5-325 MG per tablet 1 tablet (1 tablet Oral Given 08/15/16 1334)  ondansetron (ZOFRAN-ODT) disintegrating tablet 4 mg (4 mg Oral Given 08/15/16 1334)     Initial Impression / Assessment and Plan / ED Course  I have reviewed the triage vital signs and the nursing notes.  Pertinent labs & imaging results that were available during my care of the patient were reviewed by me and considered in my  medical decision making (see chart for details).     Patient evaluated by Caroll Rancher and myself. Ultrasound performed by Josh, showing 0.5 cm x 0.75 cm abscess that does not appear to be something we should drain today. Ran by M.D. who agrees. Advised patient to continue taking doxycycline for full course, and apply warm compresses. Patient to follow up with your PCP for persistent symptoms. Return precautions given, especially for respiratory symptoms. Patient may return to the emergency department at any time for worsening symptoms.  Patient's reported narcotic on the [place] controlled substances reporting system was hydrocodone yesterday at urgent care. Short course of Percocet given for pain.  Final Clinical Impressions(s) / ED Diagnoses   Final diagnoses:  Facial abscess    New Prescriptions Discharge Medication List as of 08/15/2016  2:30 PM    START taking these medications  Details  oxyCODONE-acetaminophen (PERCOCET/ROXICET) 5-325 MG tablet Take 1-2 tablets by mouth every 4 (four) hours as needed for severe pain., Starting Tue 08/15/2016, Print         Sekai Nayak, Elmer SowMichael M, PA-C 08/15/16 1517    Shaune PollackIsaacs, Cameron, MD 08/16/16 [phone]

## 2016-08-15 NOTE — ED Provider Notes (Signed)
EMERGENCY DEPARTMENT US SOFT TISSUE INTERPRETATION "Study: Limited Soft Tissue Ultrasound"  INDICATIONS: Soft tissue infection Multiple views of the body part were obtained in real-time with a multi-frequency linear probe  PERFORMED BY: Myself IMAGES ARCHIVED?: Yes SIDE:Left BODY PART:face INTERPRETATION:  Abcess present and Cellulitis present -- although abscess is very small, 1cm depth, <1cm diameter      Renne CriglerGeiple, Lynett Brasil, PA-C 08/15/16 1433    Shaune PollackIsaacs, Cameron, MD 08/16/16 [phone]

## 2016-08-15 NOTE — Discharge Instructions (Signed)
As we discussed continue taking her doxycycline as prescribed. Apply warm compresses. Take pain medication as needed, as written. Return precautions if you develop shortness of breath or difficulty breathing. Follow-up with primary care for persistent symptoms. Return to emergency department for worsening symptoms.

## 2016-08-16 ENCOUNTER — Encounter: Payer: Self-pay | Admitting: Family Medicine

## 2016-08-16 ENCOUNTER — Ambulatory Visit (INDEPENDENT_AMBULATORY_CARE_PROVIDER_SITE_OTHER): Payer: Federal, State, Local not specified - PPO | Admitting: Family Medicine

## 2016-08-16 VITALS — BP 146/102 | HR 82 | Temp 98.4°F | Ht 72.0 in | Wt 251.6 lb

## 2016-08-16 DIAGNOSIS — L0201 Cutaneous abscess of face: Secondary | ICD-10-CM

## 2016-08-16 DIAGNOSIS — Z720 Tobacco use: Secondary | ICD-10-CM | POA: Diagnosis not present

## 2016-08-16 DIAGNOSIS — F411 Generalized anxiety disorder: Secondary | ICD-10-CM | POA: Diagnosis not present

## 2016-08-16 MED ORDER — VARENICLINE TARTRATE 0.5 MG X 11 & 1 MG X 42 PO MISC: ORAL | 0 refills | Status: DC

## 2016-08-16 MED ORDER — ALPRAZOLAM 1 MG PO TABS: ORAL_TABLET | ORAL | 1 refills | Status: DC

## 2016-08-16 NOTE — Progress Notes (Signed)
[facility] [address], [address] [place], [place] [id number] [phone] [phone]  Date:  08/16/2016   Name:  [name]   DOB:  03-31-1980   MRN:  [id number]  PCP:  Pearline Cablesopland, Fendi Meinhardt C, MD    Chief Complaint: Follow-up (Urgent Care)   History of Present Illness:  [name] is a 36 y.o. very pleasant male patient who presents with the following:  Had surgery to repair strabismus on 5/11 Then see in UC on 5/21 with apparent abscess of his left cheek He was given rocephin IM and started on doxy, also given norco and zofran He then appeared at the ER yesterday- he was noted to have a small abscess on US, was given percocet as well for pain, continued abx  Here today because his left cheek is still really painful and swollen He is draining some pus from a wound in the left cheek He has a headache but has not noted a fever  He also would like a refill of his xanax today, and would like me to rx chantix for him in hopes of quitting smoking NCCSR:  He last filled xanax on 4/27 He is due a UDS today also   Patient Active Problem List   Diagnosis Date Noted  . 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 Cocaine abuse with cocaine-induced mood disorder (HCC) 10/02/2015  . Homicidal ideation   . Polysubstance abuse   . Suicidal ideation   . Paranoia (HCC) 01/14/2013  . Substance induced mood disorder (HCC) 01/14/2013  . RECTAL FISSURE 04/19/2010  . PANIC DISORDER,NO AGORAPHOBIA 03/08/2010  . ARTHROPATHY NOS, UNSPECIFIED SITE 03/08/2010  . BACK PAIN 02/14/2010    Past Medical History:  Diagnosis Date  . Anxiety   . Dental crown present   . Exotropia of both eyes 07/2016  . GERD (gastroesophageal reflux disease)   . History of MRSA infection    left leg  . Seasonal allergies     Past Surgical History:  Procedure Laterality Date  . ABSCESS DRAINAGE Left    leg - MRSA  . STRABISMUS SURGERY Bilateral  08/04/2016   Procedure: REPAIR STRABISMUS BILATERAL;  Surgeon: Verne CarrowYoung, William, MD;  Location: [facility];  Service: Ophthalmology;  Laterality: Bilateral;  . TENDON REPAIR Right    hand    Social History  Substance Use Topics  . Smoking status: Current Every Day Smoker    Packs/day: 0.50    Years: 10.00    Types: Cigarettes  . Smokeless tobacco: Never Used  . Alcohol use 0.0 oz/week     Comment: 1-2 x/month    Family History  Problem Relation Age of Onset  . Diabetes Father   . Glaucoma Father   . Healthy Sister   . Healthy Brother   . Diabetes Maternal Grandmother   . Healthy Daughter     No Known Allergies  Medication list has been reviewed and updated.  Current Outpatient Prescriptions on File Prior to Visit  Medication Sig Dispense Refill  . doxycycline (VIBRA-TABS) 100 MG tablet Take 1 tablet (100 mg total) by mouth 2 (two) times daily. 20 tablet 0  . HYDROcodone-acetaminophen (NORCO) 5-325 MG tablet Take 1 tablet by mouth every 6 (six) hours as needed for moderate pain. 12 tablet 0  . ondansetron (ZOFRAN-ODT) 8 MG disintegrating tablet Take 1 tablet (8 mg total) by mouth every 8 (eight) hours as needed for nausea. 12 tablet  0  . oxyCODONE-acetaminophen (PERCOCET/ROXICET) 5-325 MG tablet Take 1-2 tablets by mouth every 4 (four) hours as needed for severe pain. 8 tablet 0  . ranitidine (ZANTAC) 150 MG tablet Take 150 mg by mouth 2 (two) times daily.     No current facility-administered medications on file prior to visit.     Review of Systems:  As per HPI- otherwise negative.   Physical Examination: Vitals:   08/16/16 1537  BP: (!) 146/102  Pulse: 82  Temp: 98.4 F (36.9 C)   Vitals:   08/16/16 1537  Weight: 251 lb 9.6 oz (114.1 kg)  Height: 6' (1.829 m)   Body mass index is 34.12 kg/m. Ideal Body Weight: Weight in (lb) to have BMI = 25: 183.9  GEN: WDWN, NAD, Non-toxic, A & O x 3, obese/ large build.  Looks well except for left cheek  which is slightly swollen and displays a draining abscess HEENT: Atraumatic, Normocephalic. Neck supple. No masses, No LAD. Ears and Nose: No external deformity. CV: RRR, No M/G/R. No JVD. No thrill. No extra heart sounds. PULM: CTA B, no wheezes, crackles, rhonchi. No retractions. No resp. distress. No accessory muscle use. EXTR: No c/c/e NEURO Normal gait.  PSYCH: Normally interactive. Conversant. Not depressed or anxious appearing.  Calm demeanor.  Able to express some pus from wound but it is not freely draining.  Would like to I and D  VC obtained.  Prepped area around wound with betadine and trimmed beard hair from around draining wound.  Obtained culture of pus.  Anesthesia with 2ml of plain 1% lido.  Used blade to carefully extend drainiage opening of absecess and expressed blood, pus Placed approx 3/4inch of 1/4in packing and dressed wound Assessment and Plan: Abscess of face - Plan: WOUND CULTURE  GAD (generalized anxiety disorder) - Plan: ALPRAZolam (XANAX) 1 MG tablet  Tobacco abuse - Plan: varenicline (CHANTIX STARTING MONTH PAK) 0.5 MG X 11 & 1 MG X 42 tablet  I and D of abscess left cheek as above He will come in tomorrow for wound check/ packing change Continue doxycycline Xanax refilled rx for chantix for him as above  BP is up today but pt is in pain- will repeat for him tomorrow  BP Readings from Last 3 Encounters:  08/16/16 (!) 146/102  08/15/16 (!) 169/105  08/14/16 (!) 163/82     Signed Abbe Amsterdam, MD

## 2016-08-16 NOTE — Progress Notes (Signed)
Patient in for Urgent Care follow up

## 2016-08-16 NOTE — Patient Instructions (Signed)
Please come and see me tomorrow at 5pm and I will check on your wound and change the packing Leave the dressing in place until tomorrow I did refill your xanax but you need a drug screen today I also did give you an rx for chantix

## 2016-08-17 ENCOUNTER — Ambulatory Visit: Payer: Self-pay | Admitting: Family Medicine

## 2016-08-17 ENCOUNTER — Telehealth: Payer: Self-pay | Admitting: Family Medicine

## 2016-08-17 ENCOUNTER — Encounter: Payer: Self-pay | Admitting: Family Medicine

## 2016-08-17 NOTE — Telephone Encounter (Signed)
Patient called at 11:55 A.Ryan GianottiM. Stating he can not make him 2:15 appointment. Did not wish to reschedule. Charge or No Charge?

## 2016-08-17 NOTE — Telephone Encounter (Signed)
Called him back- since he is not coming in today, leave the bandage and packing in place and remove all tomorrow.  He should be able to remove the packing at home without any difficulty but asked him to contact me if any problem His wound is feeling better!  No charge for cancellation this time

## 2016-08-18 NOTE — Telephone Encounter (Signed)
Left message on voicemail for patient to call the office to inform provider if he has removed bandage and packing.

## 2016-08-19 LAB — WOUND CULTURE
GRAM STAIN: NONE SEEN
GRAM STAIN: NONE SEEN
GRAM STAIN: NONE SEEN

## 2016-08-21 ENCOUNTER — Encounter: Payer: Self-pay | Admitting: Family Medicine

## 2016-08-23 ENCOUNTER — Ambulatory Visit: Payer: Self-pay | Admitting: Family Medicine

## 2016-10-13 ENCOUNTER — Other Ambulatory Visit: Payer: Self-pay | Admitting: Family Medicine

## 2016-10-13 DIAGNOSIS — F411 Generalized anxiety disorder: Secondary | ICD-10-CM

## 2016-10-13 NOTE — Telephone Encounter (Signed)
NCCSR: last filled 30 xanax 1mg  on 6/20, 5/23, 4/27  Ok to refill

## 2016-10-13 NOTE — Telephone Encounter (Signed)
Requesting: ALPRAZolam (XANAX) 1 MG tablet  01/17/16 no contract signed  uds sample given, moderate risk next screen 04/18/16. 08/17/16 uds sample given, moderate risk next screen 02/17/17.   Last OV: 08/16/16  Last Refill: 08/16/16  Is it ok for me to call in this refill? Please Advise

## 2017-01-10 ENCOUNTER — Other Ambulatory Visit: Payer: Self-pay | Admitting: Family Medicine

## 2017-01-10 DIAGNOSIS — F411 Generalized anxiety disorder: Secondary | ICD-10-CM

## 2017-01-10 NOTE — Telephone Encounter (Signed)
Requesting: ALPRAZolam (XANAX) 1 MG tablet  08/17/16 uds sample given, moderate risk next screen 02/17/17. Last OV: 08/16/16 Last Refill: 10/13/16  Please Advise

## 2017-01-10 NOTE — Telephone Encounter (Signed)
NCCSR: he is getting 30 days of alprazolam monthly, he is due for a refill now

## 2017-03-13 ENCOUNTER — Other Ambulatory Visit: Payer: Self-pay | Admitting: Family Medicine

## 2017-03-13 DIAGNOSIS — F411 Generalized anxiety disorder: Secondary | ICD-10-CM

## 2017-04-13 ENCOUNTER — Other Ambulatory Visit: Payer: Self-pay | Admitting: Family Medicine

## 2017-04-13 DIAGNOSIS — F411 Generalized anxiety disorder: Secondary | ICD-10-CM

## 2017-06-05 NOTE — Progress Notes (Addendum)
[facility] [address], [address] [place], [place] [address] [phone]  Date:  06/06/2017   Name:  [name]   DOB:  04-Nov-1980   MRN:  [id number]  PCP:  Pearline Cables, MD    Chief Complaint: No chief complaint on file.   History of Present Illness:  [name] is a 37 y.o. very pleasant male patient who presents with the following:  I last saw him in May of 2018 with an abscess of his face He was then a no show for follow-up appt   History of cocaine abuse Most recent urine tox from 5/18 was ok however, and I have been giving him xanax on occasion for anxiety. He would like to have some xanax today due to going through a divorce,  However I will get a urine tox first as I have not seen him in nearly a year.  He is also here today with concern of high blood pressure. He has been checking his BP at [facility] for the last several months and it is generally high  He may get readings as high as 170/115 He is using a nicotine patch, he is not smoking now He does have a family history of HTN Never treated for HTN- "you told me that if my BP did not get better you would put me on pills so I stayed away"   He did have a candy bar on the way here today so will not check his cholesterol right now   BP Readings from Last 3 Encounters:  06/06/17 (!) 149/98  08/16/16 (!) 146/102  08/15/16 (!) 169/105    Patient Active Problem List   Diagnosis Date Noted  . 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 Cocaine abuse with cocaine-induced mood disorder (HCC) 10/02/2015  . Homicidal ideation   . Polysubstance abuse (HCC)   . Suicidal ideation   . Paranoia (HCC) 01/14/2013  . Substance induced mood disorder (HCC) 01/14/2013  . RECTAL FISSURE 04/19/2010  . PANIC DISORDER,NO AGORAPHOBIA 03/08/2010  . ARTHROPATHY NOS, UNSPECIFIED SITE 03/08/2010  . BACK PAIN 02/14/2010    Past Medical History:   Diagnosis Date  . Anxiety   . Dental crown present   . Exotropia of both eyes 07/2016  . GERD (gastroesophageal reflux disease)   . History of MRSA infection    left leg  . Seasonal allergies     Past Surgical History:  Procedure Laterality Date  . ABSCESS DRAINAGE Left    leg - MRSA  . STRABISMUS SURGERY Bilateral 08/04/2016   Procedure: REPAIR STRABISMUS BILATERAL;  Surgeon: Verne Carrow, MD;  Location: [facility];  Service: Ophthalmology;  Laterality: Bilateral;  . TENDON REPAIR Right    hand    Social History   Tobacco Use  . Smoking status: Current Every Day Smoker    Packs/day: 0.50    Years: 10.00    Pack years: 5.00    Types: Cigarettes  . Smokeless tobacco: Never Used  Substance Use Topics  . Alcohol use: Yes    Alcohol/week: 0.0 oz    Comment: 1-2 x/month  . Drug use: No    Comment: occassional joint    Family History  Problem Relation Age of Onset  . Diabetes Father   . Glaucoma Father   . Healthy Sister   . Healthy Brother   . Diabetes Maternal Grandmother   . Healthy Daughter  No Known Allergies  Medication list has been reviewed and updated.  Current Outpatient Medications on File Prior to Visit  Medication Sig Dispense Refill  . ALPRAZolam (XANAX) 1 MG tablet TAKE 1/2 TABLET BY MOUTH TWICE A DAY AS NEEDED FOR ANXIETY. (OFFICE VISIT REQUIRED FOR MORE REFILLS) (Patient not taking: Reported on 06/06/2017) 10 tablet 0  . doxycycline (VIBRA-TABS) 100 MG tablet Take 1 tablet (100 mg total) by mouth 2 (two) times daily. (Patient not taking: Reported on 06/06/2017) 20 tablet 0  . HYDROcodone-acetaminophen (NORCO) 5-325 MG tablet Take 1 tablet by mouth every 6 (six) hours as needed for moderate pain. (Patient not taking: Reported on 06/06/2017) 12 tablet 0  . ondansetron (ZOFRAN-ODT) 8 MG disintegrating tablet Take 1 tablet (8 mg total) by mouth every 8 (eight) hours as needed for nausea. (Patient not taking: Reported on 06/06/2017) 12  tablet 0  . oxyCODONE-acetaminophen (PERCOCET/ROXICET) 5-325 MG tablet Take 1-2 tablets by mouth every 4 (four) hours as needed for severe pain. (Patient not taking: Reported on 06/06/2017) 8 tablet 0  . ranitidine (ZANTAC) 150 MG tablet Take 150 mg by mouth 2 (two) times daily.    . varenicline (CHANTIX STARTING MONTH PAK) 0.5 MG X 11 & 1 MG X 42 tablet Take one 0.5 mg tablet by mouth once daily for 3 days, then increase to one 0.5 mg tablet twice daily for 4 days, then increase to one 1 mg tablet twice daily. (Patient not taking: Reported on 06/06/2017) 53 tablet 0   No current facility-administered medications on file prior to visit.     Review of Systems:  As per HPI- otherwise negative. Notes that he has gained some weight    Physical Examination: Vitals:   06/06/17 1538  BP: (!) 149/98  Pulse: 76  Resp: 16  Temp: 97.9 F (36.6 C)  SpO2: 100%   Vitals:   06/06/17 1538  Weight: 257 lb (116.6 kg)  Height: 5\' 11"  (1.803 m)   Body mass index is 35.84 kg/m. Ideal Body Weight: Weight in (lb) to have BMI = 25: 178.9  GEN: WDWN, NAD, Non-toxic, A & O x 3, overweight/ muscular build HEENT: Atraumatic, Normocephalic. Neck supple. No masses, No LAD. Ears and Nose: No external deformity. CV: RRR, No M/G/R. No JVD. No thrill. No extra heart sounds. PULM: CTA B, no wheezes, crackles, rhonchi. No retractions. No resp. distress. No accessory muscle use. ABD: S, NT, ND EXTR: No c/c/e NEURO Normal gait.  PSYCH: Normally interactive. Conversant. Not depressed or anxious appearing.  Calm demeanor.    Assessment and Plan: Essential hypertension - Plan: CBC, Comprehensive metabolic panel, lisinopril-hydrochlorothiazide (PRINZIDE,ZESTORETIC) 10-12.5 MG tablet  History of drug abuse - Plan: Pain Mgmt, Profile 8 w/Conf, U  Situational anxiety  Here today for a follow-up visit and concern of HTN Will start him on lisinopril/hctz today Labs pending as above Asked him to come back in 2-3  weeks for a recheck and fasting lipids Urine tox pending- if negative will be able to rx xanax for him Will plan further follow- up pending labs.   Signed Abbe AmsterdamJessica Kirt Chew, MD  Received labs so far 3/14- message to pt. Normal so far   Results for orders placed or performed in visit on 06/06/17  CBC  Result Value Ref Range   WBC 6.9 4.0 - 10.5 K/uL   RBC 4.75 4.22 - 5.81 Mil/uL   Platelets 156.0 150.0 - 400.0 K/uL   Hemoglobin 15.2 13.0 - 17.0 g/dL   HCT 43.7  39.0 - 52.0 %   MCV 91.9 78.0 - 100.0 fl   MCHC 34.7 30.0 - 36.0 g/dL   RDW 16.1 09.6 - 04.5 %  Comprehensive metabolic panel  Result Value Ref Range   Sodium 137 135 - 145 mEq/L   Potassium 4.3 3.5 - 5.1 mEq/L   Chloride 102 96 - 112 mEq/L   CO2 31 19 - 32 mEq/L   Glucose, Bld 79 70 - 99 mg/dL   BUN 19 6 - 23 mg/dL   Creatinine, Ser 4.09 0.40 - 1.50 mg/dL   Total Bilirubin 0.7 0.2 - 1.2 mg/dL   Alkaline Phosphatase 54 39 - 117 U/L   AST 19 0 - 37 U/L   ALT 22 0 - 53 U/L   Total Protein 7.1 6.0 - 8.3 g/dL   Albumin 4.6 3.5 - 5.2 g/dL   Calcium 9.5 8.4 - 81.1 mg/dL   GFR 91.47 >82.95 mL/min

## 2017-06-06 ENCOUNTER — Ambulatory Visit: Payer: Federal, State, Local not specified - PPO | Admitting: Family Medicine

## 2017-06-06 ENCOUNTER — Encounter: Payer: Self-pay | Admitting: Family Medicine

## 2017-06-06 VITALS — BP 139/94 | HR 76 | Temp 97.9°F | Resp 16 | Ht 71.0 in | Wt 257.0 lb

## 2017-06-06 DIAGNOSIS — I10 Essential (primary) hypertension: Secondary | ICD-10-CM | POA: Diagnosis not present

## 2017-06-06 DIAGNOSIS — F418 Other specified anxiety disorders: Secondary | ICD-10-CM | POA: Insufficient documentation

## 2017-06-06 DIAGNOSIS — F1911 Other psychoactive substance abuse, in remission: Secondary | ICD-10-CM | POA: Insufficient documentation

## 2017-06-06 DIAGNOSIS — Z87898 Personal history of other specified conditions: Secondary | ICD-10-CM

## 2017-06-06 MED ORDER — LISINOPRIL-HYDROCHLOROTHIAZIDE 10-12.5 MG PO TABS: 1.0000 | ORAL_TABLET | Freq: Every day | ORAL | 5 refills | Status: DC

## 2017-06-06 NOTE — Patient Instructions (Signed)
We are going to start you on lisinopril/ hctz for your blood pressure.  Please take this medication once a day.  Please see me in 2-3 weeks so we can check on how it is working for you  Assuming your urine looks ok today I can refill xanax for you.  We will also check on your liver and kidneys today  Please come in fasting next time we can do a cholesterol panel for you

## 2017-06-07 ENCOUNTER — Encounter: Payer: Self-pay | Admitting: Family Medicine

## 2017-06-07 LAB — CBC
HCT: 43.7 % (ref 39.0–52.0)
HEMOGLOBIN: 15.2 g/dL (ref 13.0–17.0)
MCHC: 34.7 g/dL (ref 30.0–36.0)
MCV: 91.9 fl (ref 78.0–100.0)
PLATELETS: 156 10*3/uL (ref 150.0–400.0)
RBC: 4.75 Mil/uL (ref 4.22–5.81)
RDW: 12.9 % (ref 11.5–15.5)
WBC: 6.9 10*3/uL (ref 4.0–10.5)

## 2017-06-07 LAB — COMPREHENSIVE METABOLIC PANEL
ALK PHOS: 54 U/L (ref 39–117)
ALT: 22 U/L (ref 0–53)
AST: 19 U/L (ref 0–37)
Albumin: 4.6 g/dL (ref 3.5–5.2)
BILIRUBIN TOTAL: 0.7 mg/dL (ref 0.2–1.2)
BUN: 19 mg/dL (ref 6–23)
CO2: 31 mEq/L (ref 19–32)
CREATININE: 1.27 mg/dL (ref 0.40–1.50)
Calcium: 9.5 mg/dL (ref 8.4–10.5)
Chloride: 102 mEq/L (ref 96–112)
GFR: 81.9 mL/min (ref >60.00)
GLUCOSE: 79 mg/dL (ref 70–99)
Potassium: 4.3 mEq/L (ref 3.5–5.1)
Sodium: 137 mEq/L (ref 135–145)
TOTAL PROTEIN: 7.1 g/dL (ref 6.0–8.3)

## 2017-06-07 LAB — PAIN MGMT, PROFILE 8 W/CONF, U
6 Acetylmorphine: NEGATIVE ng/mL (ref <10)
AMPHETAMINES: NEGATIVE ng/mL (ref <500)
Alcohol Metabolites: NEGATIVE ng/mL (ref <500)
BENZODIAZEPINES: NEGATIVE ng/mL (ref <100)
BUPRENORPHINE, URINE: NEGATIVE ng/mL (ref <5)
Cocaine Metabolite: NEGATIVE ng/mL (ref <150)
Creatinine: 70.1 mg/dL
MARIJUANA METABOLITE: NEGATIVE ng/mL (ref <20)
MDMA: NEGATIVE ng/mL (ref <500)
OXIDANT: NEGATIVE ug/mL (ref <200)
Opiates: NEGATIVE ng/mL (ref <100)
Oxycodone: NEGATIVE ng/mL (ref <100)
pH: 6.89 (ref 4.5–9.0)

## 2017-06-12 ENCOUNTER — Encounter: Payer: Self-pay | Admitting: Family Medicine

## 2017-06-12 ENCOUNTER — Other Ambulatory Visit: Payer: Self-pay | Admitting: Family Medicine

## 2017-06-12 DIAGNOSIS — F411 Generalized anxiety disorder: Secondary | ICD-10-CM

## 2017-06-12 NOTE — Telephone Encounter (Signed)
Copied from CRM [id number]. Topic: Quick Communication - Rx Refill/Question >> Jun 12, 2017 10:55 AM Stephannie LiSimmons, Kabeer Hoagland L, NT wrote: Medication:  ALPRAZolam Prudy Feeler(XANAX) 1 MG tablet  Has the patient contacted their pharmacy?  (Agent: If no, request that the patient contact the pharmacy for the refill.) Preferred Pharmacy (with phone number or street name): [facility] [phone] [place] - [id number] [facility] [address] [address] [phone] (Phone) [phone] (Fax Agent: Please be advised that RX refills may take up to 3 business days. We ask that you follow-up with your pharmacy.

## 2017-06-12 NOTE — Telephone Encounter (Signed)
Rx refill request: Xanax 1 mg  LOV: 06/06/17   PCP: Cop[place]   Pharmacy: verified

## 2017-06-13 MED ORDER — ALPRAZOLAM 1 MG PO TABS: ORAL_TABLET | ORAL | 0 refills | Status: DC

## 2017-06-13 NOTE — Telephone Encounter (Signed)
Requesting:Alprazolam  Contract:06/06/17 UDS:06/06/17 Last Visit:06/06/17 Next Visit:06/21/17 Last Refill: 04/13/17  Please Advise

## 2017-06-19 NOTE — Progress Notes (Deleted)
[facility] [address] [place], [place] [address] [phone]  Date:  06/21/2017   Name:  [name]   DOB:  1980/10/23   MRN:  [id number]  PCP:  Pearline Cables, MD    Chief Complaint: No chief complaint on file.   History of Present Illness:  [name] is a 37 y.o. very pleasant male patient who presents with the following:  Following up today regarding his BP He was here on 3/13, and we started him on lisinopril/ hctz for HTN  last saw him in May of 2018 with an abscess of his face He was then a no show for follow-up appt  History of cocaine abuse Most recent urine tox from 5/18 was ok however, and I have been giving him xanax on occasion for anxiety. He would like to have some xanax today due to going through a divorce,  However I will get a urine tox first as I have not seen him in nearly a year. He is also here today with concern of high blood pressure. He has been checking his BP at [employer] for the last several months and it is generally high  He may get readings as high as 170/115 He is using a nicotine patch, he is not smoking now He does have a family history of HTN Never treated for HTN- "you told me that if my BP did not get better you would put me on pills so I stayed away"    BP Readings from Last 3 Encounters:  06/06/17 (!) 139/94  08/16/16 (!) 146/102  08/15/16 (!) 169/105    His UDS was negative so I did refil xanax for him to use prn  Needs BMP and cholesterol today  Patient Active Problem List   Diagnosis Date Noted  . Situational anxiety 06/06/2017  . History of drug abuse 06/06/2017  . Essential hypertension 06/06/2017  . 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 Cocaine abuse with cocaine-induced mood disorder (HCC) 10/02/2015  . Homicidal ideation   . Polysubstance abuse (HCC)   . Suicidal ideation   . Paranoia (HCC) 01/14/2013  . Substance induced  mood disorder (HCC) 01/14/2013  . RECTAL FISSURE 04/19/2010  . PANIC DISORDER,NO AGORAPHOBIA 03/08/2010  . ARTHROPATHY NOS, UNSPECIFIED SITE 03/08/2010  . BACK PAIN 02/14/2010    Past Medical History:  Diagnosis Date  . Anxiety   . Dental crown present   . Exotropia of both eyes 07/2016  . GERD (gastroesophageal reflux disease)   . History of MRSA infection    left leg  . Seasonal allergies     Past Surgical History:  Procedure Laterality Date  . ABSCESS DRAINAGE Left    leg - MRSA  . STRABISMUS SURGERY Bilateral 08/04/2016   Procedure: REPAIR STRABISMUS BILATERAL;  Surgeon: Verne Carrow, MD;  Location:  [facility];  Service: Ophthalmology;  Laterality: Bilateral;  . TENDON REPAIR Right    hand    Social History   Tobacco Use  . Smoking status: Current Every Day Smoker    Packs/day: 0.50    Years: 10.00    Pack years: 5.00    Types: Cigarettes  . Smokeless tobacco: Never Used  Substance Use Topics  . Alcohol use: Yes    Alcohol/week: 0.0 oz    Comment: 1-2 x/month  . Drug use: No    Comment: occassional joint    Family History  Problem Relation Age of Onset  . Diabetes Father   . Glaucoma Father   . Healthy Sister   . Healthy Brother   . Diabetes Maternal Grandmother   . Healthy Daughter     No Known Allergies  Medication list has been reviewed and updated.  Current Outpatient Medications on File Prior to Visit  Medication Sig Dispense Refill  . ALPRAZolam (XANAX) 1 MG tablet TAKE 1/2 TABLET BY MOUTH TWICE A DAY AS NEEDED FOR ANXIETY. 25 tablet 0  . lisinopril-hydrochlorothiazide (PRINZIDE,ZESTORETIC) 10-12.5 MG tablet Take 1 tablet by mouth daily. 30 tablet 5  . ondansetron (ZOFRAN-ODT) 8 MG disintegrating tablet Take 1 tablet (8 mg total) by mouth every 8 (eight) hours as needed for nausea. (Patient not taking: Reported on 06/06/2017) 12 tablet 0  . ranitidine (ZANTAC) 150 MG tablet Take 150 mg by mouth 2 (two) times daily.     No  current facility-administered medications on file prior to visit.     Review of Systems:  As per HPI- otherwise negative.   Physical Examination: There were no vitals filed for this visit. There were no vitals filed for this visit. There is no height or weight on file to calculate BMI. Ideal Body Weight:    GEN: WDWN, NAD, Non-toxic, A & O x 3 HEENT: Atraumatic, Normocephalic. Neck supple. No masses, No LAD. Ears and Nose: No external deformity. CV: RRR, No M/G/R. No JVD. No thrill. No extra heart sounds. PULM: CTA B, no wheezes, crackles, rhonchi. No retractions. No resp. distress. No accessory muscle use. ABD: S, NT, ND, +BS. No rebound. No HSM. EXTR: No c/c/e NEURO Normal gait.  PSYCH: Normally interactive. Conversant. Not depressed or anxious appearing.  Calm demeanor.    Assessment and Plan: ***  Signed Abbe AmsterdamJessica Copland, MD

## 2017-06-21 ENCOUNTER — Other Ambulatory Visit: Payer: Self-pay | Admitting: Family Medicine

## 2017-06-21 ENCOUNTER — Ambulatory Visit: Payer: Federal, State, Local not specified - PPO | Admitting: Family Medicine

## 2017-06-21 DIAGNOSIS — Z0289 Encounter for other administrative examinations: Secondary | ICD-10-CM

## 2017-06-26 ENCOUNTER — Other Ambulatory Visit: Payer: Self-pay | Admitting: Family Medicine

## 2017-06-26 ENCOUNTER — Encounter: Payer: Self-pay | Admitting: Family Medicine

## 2017-07-27 ENCOUNTER — Other Ambulatory Visit: Payer: Self-pay | Admitting: Family Medicine

## 2017-07-27 DIAGNOSIS — F411 Generalized anxiety disorder: Secondary | ICD-10-CM

## 2017-07-30 NOTE — Telephone Encounter (Signed)
Received refill request for ALPRAZOLAM (XANAX) 1 MG tablet. Last office visit 06/06/17 and last refill 06/13/17.

## 2017-08-10 ENCOUNTER — Ambulatory Visit: Payer: Self-pay

## 2017-08-10 NOTE — Telephone Encounter (Signed)
Pt c/o feeling foggy headed and weak. Sates sx began last week. Denies vertigo. Pt denies any other sx. Pt thinks it may be one of his medicines (BP med) causing it. Appt made for Monday am. No appt available with PCP in time frame pt states he can come. Care advice given per protocol.    Reason for Disposition . [1] MILD dizziness (e.g., walking normally) AND [2] has NOT been evaluated by physician for this  (Exception: dizziness caused by heat exposure, sudden standing, or poor fluid intake)  Answer Assessment - Initial Assessment Questions 1. DESCRIPTION: "Describe your dizziness."     Feels foggyheaded- weak 2. LIGHTHEADED: "Do you feel lightheaded?" (e.g., somewhat faint, woozy, weak upon standing)     yes 3. VERTIGO: "Do you feel like either you or the room is spinning or tilting?" (i.e. vertigo)     no 4. SEVERITY: "How bad is it?"  "Do you feel like you are going to faint?" "Can you stand and walk?"   - MILD - walking normally   - MODERATE - interferes with normal activities (e.g., work, school)    - SEVERE - unable to stand, requires support to walk, feels like passing out now.      mild 5. ONSET:  "When did the dizziness begin?"     Last week 6. AGGRAVATING FACTORS: "Does anything make it worse?" (e.g., standing, change in head position)     no 7. HEART RATE: "Can you tell me your heart rate?" "How many beats in 15 seconds?"  (Note: not all patients can do this)       Pt does not know how 8. CAUSE: "What do you think is causing the dizziness?"     BP medicine 9. RECURRENT SYMPTOM: "Have you had dizziness before?" If so, ask: "When was the last time?" "What happened that time?"     no 10. OTHER SYMPTOMS: "Do you have any other symptoms?" (e.g., fever, chest pain, vomiting, diarrhea, bleeding)       no 11. PREGNANCY: "Is there any chance you are pregnant?" "When was your last menstrual period?"       n/a  Protocols used: DIZZINESS [facility]

## 2017-08-13 ENCOUNTER — Encounter: Payer: Self-pay | Admitting: Family Medicine

## 2017-08-13 ENCOUNTER — Ambulatory Visit: Payer: Federal, State, Local not specified - PPO | Admitting: Family Medicine

## 2017-08-13 VITALS — BP 130/88 | HR 71 | Temp 98.0°F | Resp 16 | Ht 71.0 in | Wt 260.0 lb

## 2017-08-13 DIAGNOSIS — I10 Essential (primary) hypertension: Secondary | ICD-10-CM | POA: Diagnosis not present

## 2017-08-13 DIAGNOSIS — R42 Dizziness and giddiness: Secondary | ICD-10-CM | POA: Insufficient documentation

## 2017-08-13 DIAGNOSIS — E785 Hyperlipidemia, unspecified: Secondary | ICD-10-CM

## 2017-08-13 LAB — COMPREHENSIVE METABOLIC PANEL
ALT: 19 U/L (ref 0–53)
AST: 16 U/L (ref 0–37)
Albumin: 4.5 g/dL (ref 3.5–5.2)
Alkaline Phosphatase: 49 U/L (ref 39–117)
BILIRUBIN TOTAL: 0.7 mg/dL (ref 0.2–1.2)
BUN: 12 mg/dL (ref 6–23)
CHLORIDE: 103 meq/L (ref 96–112)
CO2: 29 mEq/L (ref 19–32)
CREATININE: 1.2 mg/dL (ref 0.40–1.50)
Calcium: 9.3 mg/dL (ref 8.4–10.5)
GFR: 87.35 mL/min (ref >60.00)
Glucose, Bld: 84 mg/dL (ref 70–99)
Potassium: 4.5 mEq/L (ref 3.5–5.1)
Sodium: 139 mEq/L (ref 135–145)
Total Protein: 7 g/dL (ref 6.0–8.3)

## 2017-08-13 LAB — TSH: TSH: 1.27 u[IU]/mL (ref 0.35–4.50)

## 2017-08-13 LAB — CBC WITH DIFFERENTIAL/PLATELET
BASOS ABS: 0 10*3/uL (ref 0.0–0.1)
Basophils Relative: 0.1 % (ref 0.0–3.0)
Eosinophils Absolute: 0.1 10*3/uL (ref 0.0–0.7)
Eosinophils Relative: 0.7 % (ref 0.0–5.0)
HCT: 46.2 % (ref 39.0–52.0)
Hemoglobin: 16.1 g/dL (ref 13.0–17.0)
Lymphocytes Relative: 41.5 % (ref 12.0–46.0)
Lymphs Abs: 3.2 10*3/uL (ref 0.7–4.0)
MCHC: 34.9 g/dL (ref 30.0–36.0)
MCV: 91.3 fl (ref 78.0–100.0)
MONOS PCT: 8.1 % (ref 3.0–12.0)
Monocytes Absolute: 0.6 10*3/uL (ref 0.1–1.0)
NEUTROS ABS: 3.8 10*3/uL (ref 1.4–7.7)
Neutrophils Relative %: 49.6 % (ref 43.0–77.0)
Platelets: 165 10*3/uL (ref 150.0–400.0)
RBC: 5.06 Mil/uL (ref 4.22–5.81)
RDW: 13.6 % (ref 11.5–15.5)
WBC: 7.6 10*3/uL (ref 4.0–10.5)

## 2017-08-13 LAB — LIPID PANEL
CHOL/HDL RATIO: 7
Cholesterol: 223 mg/dL — ABNORMAL HIGH (ref 0–200)
HDL: 33.7 mg/dL — AB (ref >39.00)
LDL Cholesterol: 150 mg/dL — ABNORMAL HIGH (ref 0–99)
NONHDL: 189.42
Triglycerides: 196 mg/dL — ABNORMAL HIGH (ref 0.0–149.0)
VLDL: 39.2 mg/dL (ref 0.0–40.0)

## 2017-08-13 MED ORDER — LISINOPRIL 10 MG PO TABS: 10.0000 mg | ORAL_TABLET | Freq: Every day | ORAL | 1 refills | Status: DC

## 2017-08-13 NOTE — Assessment & Plan Note (Signed)
Encouraged heart healthy diet, increase exercise, avoid trans fats, consider a krill oil cap daily 

## 2017-08-13 NOTE — Assessment & Plan Note (Signed)
Poorly controlled will alter medications, encouraged DASH diet, minimize caffeine and obtain adequate sleep. Report concerning symptoms and follow up as directed and as needed Change rx to lisinopril 10 mg daily F/u pcp in 2-3 weeks

## 2017-08-13 NOTE — Progress Notes (Signed)
Patient ID: [name], male   DOB: [birth year], 37 y.o.   MRN: [id number]     Subjective:  I acted as a Neurosurgeon for Dr. Zola Button.  Apolonio Schneiders, CMA   Patient ID: [name], male    DOB: Mar 24, 1981, 37 y.o.   MRN: [id number]  Chief Complaint  Patient presents with  . light headedness    HPI  Patient is in today for light headedness.  It had been on and off for about 2 weeks.  He has been off blood pressure medication for about 3 days now and feels somewhat better.  No cp, no sob, no palpitations.   Patient Care Team: Copland, Gwenlyn Found, MD as PCP - General (Family Medicine)   Past Medical History:  Diagnosis Date  . Anxiety   . Dental crown present   . Exotropia of both eyes 07/2016  . GERD (gastroesophageal reflux disease)   . History of MRSA infection    left leg  . Seasonal allergies     Past Surgical History:  Procedure Laterality Date  . ABSCESS DRAINAGE Left    leg - MRSA  . STRABISMUS SURGERY Bilateral 08/04/2016   Procedure: REPAIR STRABISMUS BILATERAL;  Surgeon: Verne Carrow, MD;  Location: [facility];  Service: Ophthalmology;  Laterality: Bilateral;  . TENDON REPAIR Right    hand    Family History  Problem Relation Age of Onset  . Diabetes Father   . Glaucoma Father   . Healthy Sister   . Healthy Brother   . Diabetes Maternal Grandmother   . Healthy Daughter     Social History   Socioeconomic History  . Marital status: Married    Spouse name: Not on file  . Number of children: Not on file  . Years of education: Not on file  . Highest education level: Not on file  Occupational History  . Not on file  Social Needs  . Financial resource strain: Not on file  . Food insecurity:    Worry: Not on file    Inability: Not on file  . Transportation needs:    Medical: Not on file    Non-medical: Not on file  Tobacco Use  . Smoking status: Current Every Day Smoker    Packs/day: 0.50    Years: 10.00    Pack years: 5.00   Types: Cigarettes  . Smokeless tobacco: Never Used  Substance and Sexual Activity  . Alcohol use: Yes    Alcohol/week: 0.0 oz    Comment: 1-2 x/month  . Drug use: No    Comment: occassional joint  . Sexual activity: Yes  Lifestyle  . Physical activity:    Days per week: Not on file    Minutes per session: Not on file  . Stress: Not on file  Relationships  . Social connections:    Talks on phone: Not on file    Gets together: Not on file    Attends religious service: Not on file    Active member of club or organization: Not on file    Attends meetings of clubs or organizations: Not on file    Relationship status: Not on file  . Intimate partner violence:    Fear of current or ex partner: Not on file    Emotionally abused: Not on file    Physically abused: Not on file    Forced sexual activity: Not on file  Other Topics Concern  . Not on file  Social History Narrative  Lives with brother in a 2 story home.  Has 3 children.     Currently going through a divorce.  Works in Aeronautical engineer.     Education: some college.      Outpatient Medications Prior to Visit  Medication Sig Dispense Refill  . ranitidine (ZANTAC) 150 MG tablet Take 150 mg by mouth 2 (two) times daily.    Marland Kitchen ALPRAZolam (XANAX) 1 MG tablet TAKE 1/2 TABLET BY MOUTH TWICE DAILY AS NEEDED FOR ANXIETY (Patient not taking: Reported on 08/13/2017) 25 tablet 1  . lisinopril-hydrochlorothiazide (PRINZIDE,ZESTORETIC) 10-12.5 MG tablet Take 1 tablet by mouth daily. (Patient not taking: Reported on 08/13/2017) 30 tablet 5  . ondansetron (ZOFRAN-ODT) 8 MG disintegrating tablet Take 1 tablet (8 mg total) by mouth every 8 (eight) hours as needed for nausea. (Patient not taking: Reported on 06/06/2017) 12 tablet 0   No facility-administered medications prior to visit.     No Known Allergies  Review of Systems  Constitutional: Negative.  Negative for chills, fever and malaise/fatigue.  HENT: Negative for congestion and hearing  loss.   Eyes: Negative.  Negative for blurred vision and discharge.  Respiratory: Negative for cough, sputum production and shortness of breath.   Cardiovascular: Negative for chest pain, palpitations and leg swelling.  Gastrointestinal: Negative for abdominal pain, blood in stool, constipation, diarrhea, heartburn, nausea and vomiting.  Genitourinary: Negative for dysuria, frequency, hematuria and urgency.  Musculoskeletal: Negative for back pain, falls and myalgias.  Skin: Negative for rash.  Neurological: Positive for dizziness and headaches. Negative for sensory change, loss of consciousness and weakness.  Endo/Heme/Allergies: Negative for environmental allergies. Does not bruise/bleed easily.  Psychiatric/Behavioral: Negative.  Negative for depression and suicidal ideas. The patient is not nervous/anxious and does not have insomnia.        Objective:    Physical Exam  Constitutional: He is oriented to person, place, and time. Vital signs are normal. He appears well-developed and well-nourished. He is sleeping.  HENT:  Head: Normocephalic and atraumatic.  Mouth/Throat: Oropharynx is clear and moist.  Eyes: Pupils are equal, round, and reactive to light. EOM are normal. Right eye exhibits no discharge. Left eye exhibits no discharge. No scleral icterus.  Neck: Normal range of motion. Neck supple. No thyromegaly present.  Cardiovascular: Normal rate and regular rhythm.  No murmur heard. Pulmonary/Chest: Effort normal and breath sounds normal. No respiratory distress. He has no wheezes. He has no rales. He exhibits no tenderness.  Musculoskeletal: He exhibits no edema or tenderness.  Neurological: He is alert and oriented to person, place, and time.  Skin: Skin is warm and dry.  Psychiatric: He has a normal mood and affect. His behavior is normal. Judgment and thought content normal.  Nursing note and vitals reviewed.   BP 130/88 (BP Location: Left Arm, Cuff Size: Normal)   Pulse  71   Temp 98 F (36.7 C) (Oral)   Resp 16   Ht  (1.803 m)   Wt 260 lb (117.9 kg)   SpO2 98%   BMI 36.26 kg/m  Wt Readings from Last 3 Encounters:  08/13/17 260 lb (117.9 kg)  06/06/17 257 lb (116.6 kg)  08/16/16 251 lb 9.6 oz (114.1 kg)   BP Readings from Last 3 Encounters:  08/13/17 130/88  06/06/17 (!) 139/94  08/16/16 (!) 146/102     Immunization History  Administered Date(s) Administered  . Influenza,inj,Quad PF,6+ Mos 12/07/2016  . Tdap 05/31/2015    Health Maintenance  Topic Date Due  .  HIV Screening  01/22/1995  . INFLUENZA VACCINE  10/25/2017  . TETANUS/TDAP  05/30/2025    Lab Results  Component Value Date   WBC 6.9 06/06/2017   HGB 15.2 06/06/2017   HCT 43.7 06/06/2017   PLT 156.0 06/06/2017   GLUCOSE 79 06/06/2017   CHOL 224 (H) 05/31/2015   TRIG 280.0 (H) 05/31/2015   HDL 39.20 05/31/2015   LDLDIRECT 145.0 05/31/2015   ALT 22 06/06/2017   AST 19 06/06/2017   NA 137 06/06/2017   K 4.3 06/06/2017   CL 102 06/06/2017   CREATININE 1.27 06/06/2017   BUN 19 06/06/2017   CO2 31 06/06/2017   TSH 0.97 05/31/2015   HGBA1C 5.4 05/31/2015    Lab Results  Component Value Date   TSH 0.97 05/31/2015   Lab Results  Component Value Date   WBC 6.9 06/06/2017   HGB 15.2 06/06/2017   HCT 43.7 06/06/2017   MCV 91.9 06/06/2017   PLT 156.0 06/06/2017   Lab Results  Component Value Date   NA 137 06/06/2017   K 4.3 06/06/2017   CO2 31 06/06/2017   GLUCOSE 79 06/06/2017   BUN 19 06/06/2017   CREATININE 1.27 06/06/2017   BILITOT 0.7 06/06/2017   ALKPHOS 54 06/06/2017   AST 19 06/06/2017   ALT 22 06/06/2017   PROT 7.1 06/06/2017   ALBUMIN 4.6 06/06/2017   CALCIUM 9.5 06/06/2017   ANIONGAP 7 10/01/2015   GFR 81.90 06/06/2017   Lab Results  Component Value Date   CHOL 224 (H) 05/31/2015   Lab Results  Component Value Date   HDL 39.20 05/31/2015   No results found for: [facility] Lab Results  Component Value Date   TRIG 280.0 (H)  05/31/2015   Lab Results  Component Value Date   CHOLHDL 6 05/31/2015   Lab Results  Component Value Date   HGBA1C 5.4 05/31/2015         Assessment & Plan:   Problem List Items Addressed This Visit      Unprioritized   Essential hypertension    Poorly controlled will alter medications, encouraged DASH diet, minimize caffeine and obtain adequate sleep. Report concerning symptoms and follow up as directed and as needed Change rx to lisinopril 10 mg daily F/u pcp in 2-3 weeks      Relevant Medications   lisinopril (PRINIVIL,ZESTRIL) 10 MG tablet   Other Relevant Orders   CBC with Differential/Platelet   Lipid panel   Comprehensive metabolic panel   TSH   Hyperlipidemia LDL goal <100    Encouraged heart healthy diet, increase exercise, avoid trans fats, consider a krill oil cap daily      Relevant Medications   lisinopril (PRINIVIL,ZESTRIL) 10 MG tablet   Other Relevant Orders   CBC with Differential/Platelet   Lipid panel   Comprehensive metabolic panel   TSH   Light headedness - Primary    ekg NSR Check labs ? If bp was running low Changed bp meds--- f/u 2-3 weeks or sooner prn Encouraged pt to get bp cuff for home       Relevant Orders   EKG 12-Lead (Completed)   CBC with Differential/Platelet   Lipid panel   Comprehensive metabolic panel   TSH      I have discontinued Jomel L. Nan's ondansetron and lisinopril-hydrochlorothiazide. I am also having him start on lisinopril. Additionally, I am having him maintain his ranitidine and ALPRAZolam.  Meds ordered this encounter  Medications  . lisinopril (PRINIVIL,ZESTRIL) 10 MG  tablet    Sig: Take 1 tablet (10 mg total) by mouth daily.    Dispense:  30 tablet    Refill:  1    CMA served as scribe during this visit. History, Physical and Plan performed by medical provider. Documentation and orders reviewed and attested to.  Donato Schultz, DO

## 2017-08-13 NOTE — Patient Instructions (Signed)
DASH Eating Plan DASH stands for "Dietary Approaches to Stop Hypertension." The DASH eating plan is a healthy eating plan that has been shown to reduce high blood pressure (hypertension). It may also reduce your risk for type 2 diabetes, heart disease, and stroke. The DASH eating plan may also help with weight loss. What are tips for following this plan? General guidelines  Avoid eating more than 2,300 mg (milligrams) of salt (sodium) a day. If you have hypertension, you may need to reduce your sodium intake to 1,500 mg a day.  Limit alcohol intake to no more than 1 drink a day for nonpregnant women and 2 drinks a day for men. One drink equals 12 oz of beer, 5 oz of wine, or 1 oz of hard liquor.  Work with your health care provider to maintain a healthy body weight or to lose weight. Ask what an ideal weight is for you.  Get at least 30 minutes of exercise that causes your heart to beat faster (aerobic exercise) most days of the week. Activities may include walking, swimming, or biking.  Work with your health care provider or diet and nutrition specialist (dietitian) to adjust your eating plan to your individual calorie needs. Reading food labels  Check food labels for the amount of sodium per serving. Choose foods with less than 5 percent of the Daily Value of sodium. Generally, foods with less than 300 mg of sodium per serving fit into this eating plan.  To find whole grains, look for the word "whole" as the first word in the ingredient list. Shopping  Buy products labeled as "low-sodium" or "no salt added."  Buy fresh foods. Avoid canned foods and premade or frozen meals. Cooking  Avoid adding salt when cooking. Use salt-free seasonings or herbs instead of table salt or sea salt. Check with your health care provider or pharmacist before using salt substitutes.  Do not fry foods. Cook foods using healthy methods such as baking, boiling, grilling, and broiling instead.  Cook with  heart-healthy oils, such as olive, canola, soybean, or sunflower oil. Meal planning   Eat a balanced diet that includes: ? 5 or more servings of fruits and vegetables each day. At each meal, try to fill half of your plate with fruits and vegetables. ? Up to 6-8 servings of whole grains each day. ? Less than 6 oz of lean meat, poultry, or fish each day. A 3-oz serving of meat is about the same size as a deck of cards. One egg equals 1 oz. ? 2 servings of low-fat dairy each day. ? A serving of nuts, seeds, or beans 5 times each week. ? Heart-healthy fats. Healthy fats called Omega-3 fatty acids are found in foods such as flaxseeds and coldwater fish, like sardines, salmon, and mackerel.  Limit how much you eat of the following: ? Canned or prepackaged foods. ? Food that is high in trans fat, such as fried foods. ? Food that is high in saturated fat, such as fatty meat. ? Sweets, desserts, sugary drinks, and other foods with added sugar. ? Full-fat dairy products.  Do not salt foods before eating.  Try to eat at least 2 vegetarian meals each week.  Eat more home-cooked food and less restaurant, buffet, and fast food.  When eating at a restaurant, ask that your food be prepared with less salt or no salt, if possible. What foods are recommended? The items listed may not be a complete list. Talk with your dietitian about what   dietary choices are best for you. Grains Whole-grain or whole-wheat bread. Whole-grain or whole-wheat pasta. Brown rice. Oatmeal. Quinoa. Bulgur. Whole-grain and low-sodium cereals. Pita bread. Low-fat, low-sodium crackers. Whole-wheat flour tortillas. Vegetables Fresh or frozen vegetables (raw, steamed, roasted, or grilled). Low-sodium or reduced-sodium tomato and vegetable juice. Low-sodium or reduced-sodium tomato sauce and tomato paste. Low-sodium or reduced-sodium canned vegetables. Fruits All fresh, dried, or frozen fruit. Canned fruit in natural juice (without  added sugar). Meat and other protein foods Skinless chicken or turkey. Ground chicken or turkey. Pork with fat trimmed off. Fish and seafood. Egg whites. Dried beans, peas, or lentils. Unsalted nuts, nut butters, and seeds. Unsalted canned beans. Lean cuts of beef with fat trimmed off. Low-sodium, lean deli meat. Dairy Low-fat (1%) or fat-free (skim) milk. Fat-free, low-fat, or reduced-fat cheeses. Nonfat, low-sodium ricotta or cottage cheese. Low-fat or nonfat yogurt. Low-fat, low-sodium cheese. Fats and oils Soft margarine without trans fats. Vegetable oil. Low-fat, reduced-fat, or light mayonnaise and salad dressings (reduced-sodium). Canola, safflower, olive, soybean, and sunflower oils. Avocado. Seasoning and other foods Herbs. Spices. Seasoning mixes without salt. Unsalted popcorn and pretzels. Fat-free sweets. What foods are not recommended? The items listed may not be a complete list. Talk with your dietitian about what dietary choices are best for you. Grains Baked goods made with fat, such as croissants, muffins, or some breads. Dry pasta or rice meal packs. Vegetables Creamed or fried vegetables. Vegetables in a cheese sauce. Regular canned vegetables (not low-sodium or reduced-sodium). Regular canned tomato sauce and paste (not low-sodium or reduced-sodium). Regular tomato and vegetable juice (not low-sodium or reduced-sodium). Pickles. Olives. Fruits Canned fruit in a light or heavy syrup. Fried fruit. Fruit in cream or butter sauce. Meat and other protein foods Fatty cuts of meat. Ribs. Fried meat. Bacon. Sausage. Bologna and other processed lunch meats. Salami. Fatback. Hotdogs. Bratwurst. Salted nuts and seeds. Canned beans with added salt. Canned or smoked fish. Whole eggs or egg yolks. Chicken or turkey with skin. Dairy Whole or 2% milk, cream, and half-and-half. Whole or full-fat cream cheese. Whole-fat or sweetened yogurt. Full-fat cheese. Nondairy creamers. Whipped toppings.  Processed cheese and cheese spreads. Fats and oils Butter. Stick margarine. Lard. Shortening. Ghee. Bacon fat. Tropical oils, such as coconut, palm kernel, or palm oil. Seasoning and other foods Salted popcorn and pretzels. Onion salt, garlic salt, seasoned salt, table salt, and sea salt. Worcestershire sauce. Tartar sauce. Barbecue sauce. Teriyaki sauce. Soy sauce, including reduced-sodium. Steak sauce. Canned and packaged gravies. Fish sauce. Oyster sauce. Cocktail sauce. Horseradish that you find on the shelf. Ketchup. Mustard. Meat flavorings and tenderizers. Bouillon cubes. Hot sauce and Tabasco sauce. Premade or packaged marinades. Premade or packaged taco seasonings. Relishes. Regular salad dressings. Where to find more information:  National Heart, Lung, and Blood Institute: www.nhlbi.nih.gov  American Heart Association: www.heart.org Summary  The DASH eating plan is a healthy eating plan that has been shown to reduce high blood pressure (hypertension). It may also reduce your risk for type 2 diabetes, heart disease, and stroke.  With the DASH eating plan, you should limit salt (sodium) intake to 2,300 mg a day. If you have hypertension, you may need to reduce your sodium intake to 1,500 mg a day.  When on the DASH eating plan, aim to eat more fresh fruits and vegetables, whole grains, lean proteins, low-fat dairy, and heart-healthy fats.  Work with your health care provider or diet and nutrition specialist (dietitian) to adjust your eating plan to your individual   calorie needs. This information is not intended to replace advice given to you by your health care provider. Make sure you discuss any questions you have with your health care provider. Document Released: 03/02/2011 Document Revised: 03/06/2016 Document Reviewed: 03/06/2016 Elsevier Interactive Patient Education  2018 [employer]vier Inc.  

## 2017-08-13 NOTE — Assessment & Plan Note (Signed)
ekg NSR Check labs ? If bp was running low Changed bp meds--- f/u 2-3 weeks or sooner prn Encouraged pt to get bp cuff for home

## 2017-08-17 MED ORDER — ATORVASTATIN CALCIUM 20 MG PO TABS: 20.0000 mg | ORAL_TABLET | Freq: Every day | ORAL | 2 refills | Status: DC

## 2017-08-17 NOTE — Addendum Note (Signed)
Addended by: Alvira Philips on: 08/17/2017 03:46 PM   Modules accepted: Orders
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
## 2017-11-13 ENCOUNTER — Other Ambulatory Visit: Payer: Self-pay | Admitting: *Deleted

## 2017-11-13 DIAGNOSIS — I10 Essential (primary) hypertension: Secondary | ICD-10-CM

## 2017-11-13 DIAGNOSIS — E785 Hyperlipidemia, unspecified: Secondary | ICD-10-CM

## 2017-11-16 ENCOUNTER — Other Ambulatory Visit: Payer: Federal, State, Local not specified - PPO

## 2017-12-19 ENCOUNTER — Other Ambulatory Visit: Payer: Self-pay | Admitting: Family Medicine

## 2017-12-19 ENCOUNTER — Encounter: Payer: Self-pay | Admitting: Family Medicine

## 2017-12-19 DIAGNOSIS — F411 Generalized anxiety disorder: Secondary | ICD-10-CM

## 2017-12-19 NOTE — Telephone Encounter (Signed)
Reviewed [facility]- ok to refill but he needs to come in Message to pt

## 2018-01-08 ENCOUNTER — Other Ambulatory Visit: Payer: Self-pay | Admitting: Family Medicine

## 2018-01-08 DIAGNOSIS — I10 Essential (primary) hypertension: Secondary | ICD-10-CM

## 2019-04-27 ENCOUNTER — Encounter (HOSPITAL_BASED_OUTPATIENT_CLINIC_OR_DEPARTMENT_OTHER): Payer: Self-pay

## 2019-04-27 ENCOUNTER — Emergency Department (HOSPITAL_BASED_OUTPATIENT_CLINIC_OR_DEPARTMENT_OTHER)
Admission: EM | Admit: 2019-04-27 | Discharge: 2019-04-27 | Disposition: A | Discharge status: Home / Self Care | Payer: 59 | Attending: Emergency Medicine | Admitting: Emergency Medicine

## 2019-04-27 ENCOUNTER — Other Ambulatory Visit: Payer: Self-pay

## 2019-04-27 ENCOUNTER — Emergency Department (HOSPITAL_BASED_OUTPATIENT_CLINIC_OR_DEPARTMENT_OTHER): Payer: 59

## 2019-04-27 DIAGNOSIS — E785 Hyperlipidemia, unspecified: Secondary | ICD-10-CM | POA: Diagnosis not present

## 2019-04-27 DIAGNOSIS — M79621 Pain in right upper arm: Secondary | ICD-10-CM | POA: Diagnosis present

## 2019-04-27 DIAGNOSIS — I10 Essential (primary) hypertension: Secondary | ICD-10-CM | POA: Insufficient documentation

## 2019-04-27 DIAGNOSIS — F17210 Nicotine dependence, cigarettes, uncomplicated: Secondary | ICD-10-CM | POA: Diagnosis not present

## 2019-04-27 DIAGNOSIS — T881XXA Other complications following immunization, not elsewhere classified, initial encounter: Secondary | ICD-10-CM | POA: Insufficient documentation

## 2019-04-27 DIAGNOSIS — Y69 Unspecified misadventure during surgical and medical care: Secondary | ICD-10-CM | POA: Insufficient documentation

## 2019-04-27 DIAGNOSIS — Z79899 Other long term (current) drug therapy: Secondary | ICD-10-CM | POA: Insufficient documentation

## 2019-04-27 MED ORDER — DOXYCYCLINE HYCLATE 100 MG PO CAPS: 100.0000 mg | ORAL_CAPSULE | Freq: Two times a day (BID) | ORAL | 0 refills | Status: DC

## 2019-04-27 MED ORDER — NAPROXEN 500 MG PO TABS: 500.0000 mg | ORAL_TABLET | Freq: Two times a day (BID) | ORAL | 0 refills | Status: DC

## 2019-04-27 MED ORDER — DOXYCYCLINE HYCLATE 100 MG PO TABS
100.0000 mg | ORAL_TABLET | Freq: Once | ORAL | Status: AC
  Administered 2019-04-27: 100 mg via ORAL
  Filled 2019-04-27: qty 1

## 2019-04-27 NOTE — Discharge Instructions (Signed)
Your x-ray looks normal, there is no signs of damage to the bone or infection under the skin that we can see on the x-ray Please change your medicine to doxycycline twice a day for 10 days It is not unusual to have excessive amounts of pain with multiple vaccinations and at this time it looks like they were given in the wrong muscle which is why it may be hurting even more. This should get better over the week, please use ice, elevate the arm, take doxycycline as prescribed and naproxen twice a day  Return to the emergency department for severe worsening symptoms

## 2019-04-27 NOTE — ED Provider Notes (Signed)
[facility] EMERGENCY DEPARTMENT Provider Note   CSN: [id number] Arrival date & time: 04/27/19  1920     History Chief Complaint  Patient presents with  . Arm Pain    [name] is a 39 y.o. male.  HPI   This patient is a 39 year old male, presents with right-sided arm pain after getting vaccination several days ago.  He reports that he was vaccinated for the flu, pneumonia and tetanus all in the right arm, he states that it has been painful and red since that time.  He was prescribed clindamycin and Mobic for the pain and the thought that this could be cellulitis.  He was given these medicines 24 hours after the vaccination and states they are not working.  He denies fevers or chills nausea or vomiting.  Times are persistent and worse with palpation of the arm  Past Medical History:  Diagnosis Date  . Anxiety   . Dental crown present   . Exotropia of both eyes 07/2016  . GERD (gastroesophageal reflux disease)   . History of MRSA infection    left leg  . Seasonal allergies     Patient Active Problem List   Diagnosis Date Noted  . Light headedness 08/13/2017  . Hyperlipidemia LDL goal <100 08/13/2017  . Situational anxiety 06/06/2017  . History of drug abuse (HCC) 06/06/2017  . Essential hypertension 06/06/2017  . 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 Cocaine abuse with cocaine-induced mood disorder (HCC) 10/02/2015  . Homicidal ideation   . Polysubstance abuse (HCC)   . Suicidal ideation   . Paranoia (HCC) 01/14/2013  . Substance induced mood disorder (HCC) 01/14/2013  . RECTAL FISSURE 04/19/2010  . PANIC DISORDER,NO AGORAPHOBIA 03/08/2010  . ARTHROPATHY NOS, UNSPECIFIED SITE 03/08/2010  . BACK PAIN 02/14/2010    Past Surgical History:  Procedure Laterality Date  . ABSCESS DRAINAGE Left    leg - MRSA  . STRABISMUS SURGERY Bilateral 08/04/2016   Procedure: REPAIR STRABISMUS BILATERAL;  Surgeon: Verne Carrow, MD;  Location: [facility];  Service: Ophthalmology;  Laterality: Bilateral;  . TENDON REPAIR Right    hand       Family History  Problem Relation Age of Onset  . Diabetes Father   . Glaucoma Father   . Healthy Sister   . Healthy Brother   . Diabetes Maternal Grandmother   . Healthy Daughter     Social History   Tobacco Use  . Smoking status: Current Every Day Smoker    Packs/day: 0.50    Years: 10.00    Pack years: 5.00    Types: Cigarettes  . Smokeless tobacco: Never Used  Substance Use Topics  . Alcohol use: Yes    Alcohol/week: 0.0 standard drinks    Comment: 1-2 x/month  . Drug use: No    Comment: occassional joint    Home Medications Prior to Admission medications   Medication Sig Start Date End Date Taking? Authorizing Provider  ALPRAZolam (XANAX) 0.25 MG tablet Take 0.25 mg by mouth 2 (two) times daily as needed. 04/23/19   [provider]  amLODipine (NORVASC) 2.5 MG tablet Take 2.5 mg by mouth daily. 04/23/19   [provider]  atorvastatin (LIPITOR) 20 MG tablet Take 1 tablet (20 mg total) by mouth at bedtime. 08/17/17   Donato Schultz, DO  doxycycline (VIBRAMYCIN) 100 MG capsule Take 1 capsule (100 mg total) by mouth 2 (two) times daily. 04/27/19   Eber Hong,  MD  FLUoxetine (PROZAC) 20 MG capsule Take 20 mg by mouth daily. 04/23/19   [provider]  lisinopril (PRINIVIL,ZESTRIL) 10 MG tablet Take 1 tablet (10 mg total) by mouth daily. 01/10/18   Copland, Gwenlyn Found, MD  naproxen (NAPROSYN) 500 MG tablet Take 1 tablet (500 mg total) by mouth 2 (two) times daily with a meal. 04/27/19   Eber Hong, MD  ranitidine (ZANTAC) 150 MG tablet Take 150 mg by mouth 2 (two) times daily.    [provider]    Allergies    Patient has no known allergies.  Review of Systems   Review of Systems  Constitutional: Negative for fever.  Musculoskeletal: Positive for myalgias. Negative for joint swelling.  Skin: Positive for rash.     Physical Exam Updated Vital Signs BP (!) 150/106 (BP Location: Left Wrist)   Pulse 75   Temp 98.2 F (36.8 C) (Oral)   Resp 20   Ht 1.803 m (5\' 11" )   Wt 115.2 kg   SpO2 99%   BMI 35.43 kg/m   Physical Exam Vitals and nursing note reviewed.  Constitutional:      Appearance: He is well-developed. He is not diaphoretic.  HENT:     Head: Normocephalic and atraumatic.  Eyes:     General:        Right eye: No discharge.        Left eye: No discharge.     Conjunctiva/sclera: Conjunctivae normal.  Pulmonary:     Effort: Pulmonary effort is normal. No respiratory distress.  Musculoskeletal:        General: Tenderness present.     Comments: The patient's right arm was evaluated and inspected, there does appear to be some redness at the site of injection which was right into the belly of the triceps muscle.  There is redness and swelling in that area but the skin is very soft and supple with no overlying induration or peau d'orange, the elbow was evaluated and has full range of motion to both pronation supination as well as flexion and extension.  Skin:    General: Skin is warm and dry.     Findings: No erythema or rash.     Comments: Minimal increased hyperpigmentation or hue but no significant erythema of the right arm at the injection site  Neurological:     Mental Status: He is alert.     Coordination: Coordination normal.     ED Results / Procedures / Treatments   Labs (all labs ordered are listed, but only abnormal results are displayed) Labs Reviewed - No data to display  EKG None  Radiology DG Humerus Right  Result Date: 04/27/2019 CLINICAL DATA:  39 year old male s/p vaccination 3 days ago with right arm pain, swelling and redness. Started on clindamycin. EXAM: RIGHT HUMERUS - 2+ VIEW COMPARISON:  None. FINDINGS: Bone mineralization is within normal limits. There is no evidence of fracture or other focal bone lesions. Alignment appears preserved at the right  shoulder and elbow. No soft tissue gas. No radiopaque foreign body identified. IMPRESSION: Negative. Electronically Signed   By: 24 M.D.   On: 04/27/2019 19:58    Procedures Procedures (including critical care time)  Medications Ordered in ED Medications  doxycycline (VIBRA-TABS) tablet 100 mg (100 mg Oral Given 04/27/19 1955)    ED Course  I have reviewed the triage vital signs and the nursing notes.  Pertinent labs & imaging results that were available during my care of the  patient were reviewed by me and considered in my medical decision making (see chart for details).    MDM Rules/Calculators/A&P                      The patient is totally normal strength and sensation of that right upper extremity, there is tenderness in the belly of the tricep muscle and I suspect that there is some degree of bleeding and minor trauma from the vaccination site which seem to be inferior to the deltoid.  The patient will have an x-ray to make sure there is no other signs of subcutaneous deep tissue air or other abnormality.  Vital signs show no fever or tachycardia, he is hypertensive and has not yet started the medications which were prescribed which was amlodipine.  At this time we will switch over to Doxy and have him stop taking Clinda just in case there is an underlying infection though I suspect the majority of this is to minor trauma.  I have personally viewed the x-rays of the right humerus, the joints appear preserved, the bone looks normal, the soft tissues have no subcutaneous air.  Read by radiology as a normal x-ray as well.  Final Clinical Impression(s) / ED Diagnoses Final diagnoses:  Vaccination complication, initial encounter    Rx / DC Orders ED Discharge Orders         Ordered    doxycycline (VIBRAMYCIN) 100 MG capsule  2 times daily     04/27/19 2003    naproxen (NAPROSYN) 500 MG tablet  2 times daily with meals     04/27/19 Stefani Dama,  MD 04/27/19 2004

## 2019-04-27 NOTE — ED Triage Notes (Signed)
Pt received a tetanus, pneumonia, and flu vaccine 3 days ago. Pt c/o R arm pain, swelling, and redness. Pt was started on clindamycin by PCP.

## 2024-03-24 ENCOUNTER — Ambulatory Visit: Payer: Self-pay

## 2024-03-24 ENCOUNTER — Telehealth: Payer: Self-pay

## 2024-03-24 NOTE — Telephone Encounter (Signed)
 Spoke to him. Advised him that he has never been seen at out practice and the last known PCP we have is from our [facility] in 2019. He said he has been in [place] for the last 6 years. I advised him to contact that office and ask for refills until he sees someone here. He mentioned he had an appt today but it was cancelled. I told him I understand and it was out of the doctor's control for that, but it is not safe for us  to do medications when we are not aware of is history the last 6 years. He said he may look for a different practice to get him in sooner.

## 2024-03-24 NOTE — Telephone Encounter (Signed)
 Copied from CRM [id number]. Topic: Clinical - Medication Question >> Mar 24, 2024  4:17 PM Alfonso ORN wrote: Reason for CRM: pt requesting bridge of medication while awaiting new patient visit for feb. Please advise

## 2024-04-29 ENCOUNTER — Telehealth: Admitting: General Practice

## 2024-04-29 ENCOUNTER — Encounter: Payer: Self-pay | Admitting: General Practice

## 2024-04-29 VITALS — Ht 72.0 in | Wt 242.0 lb

## 2024-04-29 DIAGNOSIS — F1911 Other psychoactive substance abuse, in remission: Secondary | ICD-10-CM

## 2024-04-29 DIAGNOSIS — E785 Hyperlipidemia, unspecified: Secondary | ICD-10-CM

## 2024-04-29 DIAGNOSIS — Z7689 Persons encountering health services in other specified circumstances: Secondary | ICD-10-CM | POA: Insufficient documentation

## 2024-04-29 DIAGNOSIS — N529 Male erectile dysfunction, unspecified: Secondary | ICD-10-CM | POA: Insufficient documentation

## 2024-04-29 DIAGNOSIS — E66811 Obesity, class 1: Secondary | ICD-10-CM | POA: Insufficient documentation

## 2024-04-29 DIAGNOSIS — F418 Other specified anxiety disorders: Secondary | ICD-10-CM

## 2024-04-29 DIAGNOSIS — H349 Unspecified retinal vascular occlusion: Secondary | ICD-10-CM

## 2024-04-29 DIAGNOSIS — F410 Panic disorder [episodic paroxysmal anxiety] without agoraphobia: Secondary | ICD-10-CM

## 2024-04-29 DIAGNOSIS — I10 Essential (primary) hypertension: Secondary | ICD-10-CM

## 2024-04-29 MED ORDER — ALPRAZOLAM 0.25 MG PO TABS: 0.2500 mg | ORAL_TABLET | Freq: Every evening | ORAL | 0 refills | Status: AC | PRN

## 2024-04-29 MED ORDER — TADALAFIL 10 MG PO TABS: 10.0000 mg | ORAL_TABLET | Freq: Every day | ORAL | 0 refills | Status: AC | PRN

## 2024-04-29 NOTE — Patient Instructions (Addendum)
 I will see you for physical and fasting labs on 05/14/24 at 2 pm.   Continue using compression and elevate your legs.   Start monitoring your blood pressure daily, around the same time of day, for the next 2-3 weeks.  Ensure that you have rested for 30 minutes prior to checking your blood pressure.   Record your readings and notify me if you see numbers consistently at or above 130 on top and/or 90 on bottom.  Cut back on salt. Increase exercise.   It was a pleasure to meet you today! Please don't hesitate to contact me with any questions. Welcome to [facility]!

## 2024-04-29 NOTE — Assessment & Plan Note (Signed)
 In remission

## 2024-04-29 NOTE — Assessment & Plan Note (Signed)
 EMR reviewed briefly.

## 2024-04-29 NOTE — Assessment & Plan Note (Signed)
Lipid panel at next visit

## 2024-05-01 ENCOUNTER — Ambulatory Visit: Payer: Self-pay

## 2024-05-14 ENCOUNTER — Encounter: Admitting: General Practice
